# Patient Record
Sex: Male | Born: 1975 | Race: Black or African American | Hispanic: No | Marital: Single | Smoking: Current every day smoker
Health system: Southern US, Community
[De-identification: ages and names within clinical notes are randomized; demographics above are authoritative.]

## PROBLEM LIST (undated history)

## (undated) DIAGNOSIS — J45909 Unspecified asthma, uncomplicated: Secondary | ICD-10-CM

## (undated) HISTORY — DX: Unspecified asthma, uncomplicated: J45.909

---

## 2003-08-06 ENCOUNTER — Emergency Department (HOSPITAL_COMMUNITY): Admission: EM | Admit: 2003-08-06 | Discharge: 2003-08-06 | Payer: Self-pay | Admitting: Emergency Medicine

## 2007-07-05 ENCOUNTER — Emergency Department (HOSPITAL_COMMUNITY): Admission: EM | Admit: 2007-07-05 | Discharge: 2007-07-05 | Payer: Self-pay | Admitting: Family Medicine

## 2007-11-15 ENCOUNTER — Emergency Department (HOSPITAL_COMMUNITY): Admission: EM | Admit: 2007-11-15 | Discharge: 2007-11-16 | Payer: Self-pay | Admitting: *Deleted

## 2008-12-31 ENCOUNTER — Emergency Department (HOSPITAL_COMMUNITY): Admission: EM | Admit: 2008-12-31 | Discharge: 2008-12-31 | Payer: Self-pay | Admitting: Emergency Medicine

## 2009-06-30 ENCOUNTER — Emergency Department (HOSPITAL_COMMUNITY): Admission: EM | Admit: 2009-06-30 | Discharge: 2009-06-30 | Payer: Self-pay | Admitting: Emergency Medicine

## 2010-11-03 LAB — GC/CHLAMYDIA PROBE AMP, GENITAL: Chlamydia, DNA Probe: NEGATIVE

## 2010-11-08 LAB — URINALYSIS, ROUTINE W REFLEX MICROSCOPIC
Bilirubin Urine: NEGATIVE
Glucose, UA: NEGATIVE
Hgb urine dipstick: NEGATIVE
Nitrite: NEGATIVE
Specific Gravity, Urine: 1.025
pH: 7

## 2011-01-08 ENCOUNTER — Emergency Department (HOSPITAL_COMMUNITY)
Admission: EM | Admit: 2011-01-08 | Discharge: 2011-01-08 | Disposition: A | Discharge status: Home / Self Care | Payer: Self-pay

## 2012-06-20 ENCOUNTER — Ambulatory Visit (INDEPENDENT_AMBULATORY_CARE_PROVIDER_SITE_OTHER): Payer: BC Managed Care – PPO | Admitting: Emergency Medicine

## 2012-06-20 VITALS — BP 97/62 | HR 78 | Temp 98.1°F | Resp 16 | Ht 73.0 in | Wt 155.0 lb

## 2012-06-20 DIAGNOSIS — B353 Tinea pedis: Secondary | ICD-10-CM

## 2012-06-20 DIAGNOSIS — M79609 Pain in unspecified limb: Secondary | ICD-10-CM

## 2012-06-20 DIAGNOSIS — M79671 Pain in right foot: Secondary | ICD-10-CM

## 2012-06-20 DIAGNOSIS — B351 Tinea unguium: Secondary | ICD-10-CM

## 2012-06-20 MED ORDER — TERBINAFINE HCL 250 MG PO TABS: 250.0000 mg | ORAL_TABLET | Freq: Every day | ORAL | Status: DC

## 2012-06-20 NOTE — Patient Instructions (Addendum)
Ringworm, Nail  A fungal infection of the nail (tinea unguium/onychomycosis) is common. It is common as the visible part of the nail is composed of dead cells which have no blood supply to help prevent infection. It occurs because fungi are everywhere and will pick any opportunity to grow on any dead material.  Because nails are very slow growing they require up to 2 years of treatment with anti-fungal medications. The entire nail back to the base is infected. This includes approximately  of the nail which you cannot see.  If your caregiver has prescribed a medication by mouth, take it every day and as directed. No progress will be seen for at least 6 to 9 months. Do not be disappointed! Because fungi live on dead cells with little or no exposure to blood supply, medication delivery to the infection is slow; thus the cure is slow. It is also why you can observe no progress in the first 6 months. The nail becoming cured is the base of the nail, as it has the blood supply. Topical medication such as creams and ointments are usually not effective. Important in successful treatment of nail fungus is closely following the medication regimen that your doctor prescribes.  Sometimes you and your caregiver may elect to speed up this process by surgical removal of all the nails. Even this may still require 6 to 9 months of additional oral medications.  See your caregiver as directed. Remember there will be no visible improvement for at least 6 months. See your caregiver sooner if other signs of infection (redness and swelling) develop.  Document Released: 01/22/2000 Document Revised: 04/18/2011 Document Reviewed: 04/01/2008  ExitCare Patient Information 2013 [employer].

## 2012-06-20 NOTE — Progress Notes (Signed)
[facility] [address] [place] [address] [id number]  Date:  06/20/2012   Name:  Christopher Pitts   DOB:  Sep 30, 1975   MRN:  [id number]  PCP:  No PCP Per Patient    Chief Complaint: Rash   History of Present Illness:  Christopher Pitts is a 37 y.o. very pleasant male patient who presents with the following:  Pain in web between fourth and fifth toes on right foot.  Has marked thickening of great toe and fifth toe nails.  No fever or chills.  No swelling or redness No improvement with over the counter medications or other home remedies. Denies other complaint or health concern today.   There are no active problems to display for this patient.   History reviewed. No pertinent past medical history.  History reviewed. No pertinent past surgical history.  History  Substance Use Topics  . Smoking status: Current Every Day Smoker -- 0.50 packs/day    Types: Cigarettes  . Smokeless tobacco: Not on file  . Alcohol Use: Yes    Family History  Problem Relation Age of Onset  . Diabetes Mother   . Cancer Father     No Known Allergies  Medication list has been reviewed and updated.  No current outpatient prescriptions on file prior to visit.   No current facility-administered medications on file prior to visit.    Review of Systems:  As per HPI, otherwise negative.    Physical Examination: Filed Vitals:   06/20/12 1509  BP: 97/62  Pulse: 78  Temp: 98.1 F (36.7 C)  Resp: 16   Filed Vitals:   06/20/12 1509  Height: 6\' 1"  (1.854 m)  Weight: 155 lb (70.308 kg)   Body mass index is 20.45 kg/(m^2). Ideal Body Weight: Weight in (lb) to have BMI = 25: 189.1   GEN: WDWN, NAD, Non-toxic, Alert & Oriented x 3 HEENT: Atraumatic, Normocephalic.  Ears and Nose: No external deformity. EXTR: No clubbing/cyanosis/edema NEURO: Normal gait.  PSYCH: Normally interactive. Conversant. Not depressed or anxious appearing.  Calm demeanor.  RIGHT FOOT:   Marked tinea pedis, onychomycosis.    Assessment and Plan: Onychomycosis Foot pain Tinea pedis Monthly LFT Terbinafine   Signed,  Phillips Odor, MD

## 2012-06-26 ENCOUNTER — Other Ambulatory Visit: Payer: Self-pay

## 2012-06-26 MED ORDER — TERBINAFINE HCL 250 MG PO TABS: 250.0000 mg | ORAL_TABLET | Freq: Every day | ORAL | Status: DC

## 2013-07-25 ENCOUNTER — Ambulatory Visit: Payer: No Typology Code available for payment source

## 2018-06-15 ENCOUNTER — Encounter (HOSPITAL_COMMUNITY): Payer: Self-pay | Admitting: Emergency Medicine

## 2018-06-15 ENCOUNTER — Ambulatory Visit (HOSPITAL_COMMUNITY)
Admission: EM | Admit: 2018-06-15 | Discharge: 2018-06-15 | Disposition: A | Discharge status: Home / Self Care | Payer: Self-pay

## 2018-06-15 ENCOUNTER — Emergency Department (HOSPITAL_COMMUNITY): Payer: Self-pay

## 2018-06-15 ENCOUNTER — Emergency Department (HOSPITAL_COMMUNITY)
Admission: EM | Admit: 2018-06-15 | Discharge: 2018-06-15 | Disposition: A | Discharge status: Home / Self Care | Payer: Self-pay | Attending: Emergency Medicine | Admitting: Emergency Medicine

## 2018-06-15 ENCOUNTER — Other Ambulatory Visit: Payer: Self-pay

## 2018-06-15 ENCOUNTER — Encounter (HOSPITAL_COMMUNITY): Payer: Self-pay

## 2018-06-15 DIAGNOSIS — J45909 Unspecified asthma, uncomplicated: Secondary | ICD-10-CM | POA: Insufficient documentation

## 2018-06-15 DIAGNOSIS — L02215 Cutaneous abscess of perineum: Secondary | ICD-10-CM

## 2018-06-15 DIAGNOSIS — Z1159 Encounter for screening for other viral diseases: Secondary | ICD-10-CM | POA: Insufficient documentation

## 2018-06-15 DIAGNOSIS — F17210 Nicotine dependence, cigarettes, uncomplicated: Secondary | ICD-10-CM | POA: Insufficient documentation

## 2018-06-15 LAB — CBC
HCT: 34.6 % — ABNORMAL LOW (ref 39.0–52.0)
Hemoglobin: 12 g/dL — ABNORMAL LOW (ref 13.0–17.0)
MCH: 33.4 pg (ref 26.0–34.0)
MCHC: 34.7 g/dL (ref 30.0–36.0)
MCV: 96.4 fL (ref 80.0–100.0)
Platelets: 256 10*3/uL (ref 150–400)
RBC: 3.59 MIL/uL — ABNORMAL LOW (ref 4.22–5.81)
RDW: 13.4 % (ref 11.5–15.5)
WBC: 13.9 10*3/uL — ABNORMAL HIGH (ref 4.0–10.5)
nRBC: 0 % (ref 0.0–0.2)

## 2018-06-15 LAB — COMPREHENSIVE METABOLIC PANEL
ALT: 30 U/L (ref 0–44)
AST: 37 U/L (ref 15–41)
Albumin: 3.2 g/dL — ABNORMAL LOW (ref 3.5–5.0)
Alkaline Phosphatase: 72 U/L (ref 38–126)
Anion gap: 13 (ref 5–15)
BUN: 7 mg/dL (ref 6–20)
CO2: 23 mmol/L (ref 22–32)
Calcium: 8.9 mg/dL (ref 8.9–10.3)
Chloride: 97 mmol/L — ABNORMAL LOW (ref 98–111)
Creatinine, Ser: 0.89 mg/dL (ref 0.61–1.24)
GFR calc Af Amer: >60 mL/min (ref >60)
GFR calc non Af Amer: >60 mL/min (ref >60)
Glucose, Bld: 98 mg/dL (ref 70–99)
Potassium: 4.3 mmol/L (ref 3.5–5.1)
Sodium: 133 mmol/L — ABNORMAL LOW (ref 135–145)
Total Bilirubin: 1 mg/dL (ref 0.3–1.2)
Total Protein: 6.1 g/dL — ABNORMAL LOW (ref 6.5–8.1)

## 2018-06-15 LAB — SARS CORONAVIRUS 2 BY RT PCR (HOSPITAL ORDER, PERFORMED IN ~~LOC~~ HOSPITAL LAB): SARS Coronavirus 2: NEGATIVE

## 2018-06-15 MED ORDER — HYDROMORPHONE HCL 1 MG/ML IJ SOLN
0.5000 mg | Freq: Once | INTRAMUSCULAR | Status: AC
  Administered 2018-06-15: 15:00:00 0.5 mg via INTRAVENOUS
  Filled 2018-06-15: qty 1

## 2018-06-15 MED ORDER — IOHEXOL 300 MG/ML  SOLN
100.0000 mL | Freq: Once | INTRAMUSCULAR | Status: AC | PRN
  Administered 2018-06-15: 100 mL via INTRAVENOUS

## 2018-06-15 MED ORDER — CEPHALEXIN 500 MG PO CAPS: 500.0000 mg | ORAL_CAPSULE | Freq: Three times a day (TID) | ORAL | 0 refills | Status: AC

## 2018-06-15 MED ORDER — CEPHALEXIN 250 MG PO CAPS
500.0000 mg | ORAL_CAPSULE | Freq: Once | ORAL | Status: AC
  Administered 2018-06-15: 500 mg via ORAL
  Filled 2018-06-15: qty 2

## 2018-06-15 MED ORDER — FENTANYL CITRATE (PF) 100 MCG/2ML IJ SOLN
100.0000 ug | Freq: Once | INTRAMUSCULAR | Status: AC
  Administered 2018-06-15: 100 ug via INTRAVENOUS
  Filled 2018-06-15: qty 2

## 2018-06-15 MED ORDER — HYDROMORPHONE HCL 1 MG/ML IJ SOLN
1.0000 mg | Freq: Once | INTRAMUSCULAR | Status: AC
  Administered 2018-06-15: 1 mg via INTRAVENOUS
  Filled 2018-06-15: qty 1

## 2018-06-15 MED ORDER — LIDOCAINE HCL 2 % IJ SOLN
20.0000 mL | Freq: Once | INTRAMUSCULAR | Status: AC
  Administered 2018-06-15: 400 mg
  Filled 2018-06-15: qty 20

## 2018-06-15 MED ORDER — SULFAMETHOXAZOLE-TRIMETHOPRIM 800-160 MG PO TABS: 1.0000 | ORAL_TABLET | Freq: Two times a day (BID) | ORAL | 0 refills | Status: AC

## 2018-06-15 MED ORDER — SODIUM CHLORIDE 0.9 % IV BOLUS
1000.0000 mL | Freq: Once | INTRAVENOUS | Status: AC
  Administered 2018-06-15: 1000 mL via INTRAVENOUS

## 2018-06-15 MED ORDER — SULFAMETHOXAZOLE-TRIMETHOPRIM 800-160 MG PO TABS
1.0000 | ORAL_TABLET | Freq: Once | ORAL | Status: AC
  Administered 2018-06-15: 1 via ORAL
  Filled 2018-06-15: qty 1

## 2018-06-15 NOTE — Discharge Instructions (Signed)
Recommending further evaluation and management in the ED for rectal abscess.  Patient aware and in agreement with plan.  Escorted to ED by nursing staff.

## 2018-06-15 NOTE — Discharge Instructions (Addendum)
You were evaluated in the Emergency Department and after careful evaluation, we did not find any emergent condition requiring admission or further testing in the hospital.  Your symptoms today seem to be due to an abscess, which we drained in the emergency department.  Your CT today did not show any other complicating findings.  As discussed, please gently pull 1 to 2 inches of the packing material out of the wound once daily until the packing material falls out.  Please take both antibiotics as directed.  Please return to the Emergency Department if you experience any worsening of your condition.  We encourage you to follow up with a primary care provider.  Thank you for allowing [place] to be a part of your care.

## 2018-06-15 NOTE — ED Notes (Signed)
Supplies for I&D placed at bedside and assisted Dr. Pilar Plate.  Large amount of drainage noted.  Packing done by Dr. Aggie Hacker.  Pt states he got complete pain relief after drainage

## 2018-06-15 NOTE — ED Triage Notes (Signed)
Pt sent as referral from urgent care for eval of rectal abscess x 3 days. Pt reports urgent care stated "they might not be able to take care of it as it is too deep". Pt denies fever/chills, denies hx of same

## 2018-06-15 NOTE — ED Notes (Signed)
Patient verbalizes understanding of discharge instructions. Opportunity for questioning and answers were provided. Armband removed by staff, pt discharged from ED ambulatory.   

## 2018-06-15 NOTE — ED Provider Notes (Signed)
[facility] Emergency Department Provider Note MRN:  [id number]  Arrival date & time: 06/15/18     Chief Complaint   Abscess   History of Present Illness   Christopher Pitts is a 43 y.o. year-old male with no pertinent past medical history presenting to the ED with chief complaint of abscess.  3 days of rectal pain and swelling that he describes as an abscess.  Pain is now severe, 8 out of 10, severe pain with sitting or attempting a bowel movement.  Denies fever, no headache or vision change, no chest pain or shortness of breath, no abdominal pain.  The pain seems to radiate into the scrotum.  No other exacerbating or alleviating factors pain is constant.  Review of Systems  A complete 10 system review of systems was obtained and all systems are negative except as noted in the HPI and PMH.   Patient's Health History    Past Medical History:  Diagnosis Date  . Asthma     History reviewed. No pertinent surgical history.  Family History  Problem Relation Age of Onset  . Diabetes Mother   . Cancer Father     Social History   Socioeconomic History  . Marital status: Single    Spouse name: Not on file  . Number of children: Not on file  . Years of education: Not on file  . Highest education level: Not on file  Occupational History  . Not on file  Social Needs  . Financial resource strain: Not on file  . Food insecurity:    Worry: Not on file    Inability: Not on file  . Transportation needs:    Medical: Not on file    Non-medical: Not on file  Tobacco Use  . Smoking status: Current Every Day Smoker    Packs/day: 0.50    Types: Cigarettes  Substance and Sexual Activity  . Alcohol use: Yes    Alcohol/week: 7.0 standard drinks    Types: 7 Standard drinks or equivalent per week  . Drug use: No  . Sexual activity: Yes  Lifestyle  . Physical activity:    Days per week: Not on file    Minutes per session: Not on file  . Stress: Not on file   Relationships  . Social connections:    Talks on phone: Not on file    Gets together: Not on file    Attends religious service: Not on file    Active member of club or organization: Not on file    Attends meetings of clubs or organizations: Not on file    Relationship status: Not on file  . Intimate partner violence:    Fear of current or ex partner: Not on file    Emotionally abused: Not on file    Physically abused: Not on file    Forced sexual activity: Not on file  Other Topics Concern  . Not on file  Social History Narrative  . Not on file     Physical Exam  Vital Signs and Nursing Notes reviewed Vitals:   06/15/18 1511 06/15/18 1800  BP: (!) 145/82 124/80  Pulse: 71 67  Resp: 20 18  Temp:  98.2 F (36.8 C)  SpO2: 100% 99%    CONSTITUTIONAL: Well-appearing, NAD NEURO:  Alert and oriented x 3, no focal deficits EYES:  eyes equal and reactive ENT/NECK:  no LAD, no JVD CARDIO: Regular rate, well-perfused, normal S1 and S2 PULM:  CTAB no wheezing  or rhonchi GI/GU:  normal bowel sounds, non-distended, non-tender MSK/SPINE:  No gross deformities, no edema SKIN: There is a round 3 to 4 cm exquisitely tender abscess of the perineum adjacent to and seemingly extending into the anus as well as the more posterior scrotum; there is a possible fistula or tract adjacent to the abscess that is also moderately tender to palpation PSYCH:  Appropriate speech and behavior  Diagnostic and Interventional Summary    Labs Reviewed  CBC - Abnormal; Notable for the following components:      Result Value   WBC 13.9 (*)    RBC 3.59 (*)    Hemoglobin 12.0 (*)    HCT 34.6 (*)    All other components within normal limits  COMPREHENSIVE METABOLIC PANEL - Abnormal; Notable for the following components:   Sodium 133 (*)    Chloride 97 (*)    Total Protein 6.1 (*)    Albumin 3.2 (*)    All other components within normal limits  SARS CORONAVIRUS 2 (HOSPITAL ORDER, PERFORMED IN [facility])  LACTIC ACID, PLASMA    CT PELVIS W CONTRAST  Final Result      Medications  HYDROmorphone (DILAUDID) injection 1 mg (1 mg Intravenous Given 06/15/18 1337)  sodium chloride 0.9 % bolus 1,000 mL (0 mLs Intravenous Stopped 06/15/18 1438)  HYDROmorphone (DILAUDID) injection 0.5 mg (0.5 mg Intravenous Given 06/15/18 1507)  iohexol (OMNIPAQUE) 300 MG/ML solution 100 mL (100 mLs Intravenous Contrast Given 06/15/18 1632)  lidocaine (XYLOCAINE) 2 % (with pres) injection 400 mg (400 mg Other Given 06/15/18 1741)  fentaNYL (SUBLIMAZE) injection 100 mcg (100 mcg Intravenous Given 06/15/18 1740)  cephALEXin (KEFLEX) capsule 500 mg (500 mg Oral Given 06/15/18 1738)  sulfamethoxazole-trimethoprim (BACTRIM DS) 800-160 MG per tablet 1 tablet (1 tablet Oral Given 06/15/18 1738)     .Marland KitchenIncision and Drainage Date/Time: 06/15/2018 6:20 PM Performed by: Sabas Sous, MD Authorized by: Sabas Sous, MD   Consent:    Consent obtained:  Verbal   Consent given by:  Patient   Risks discussed:  Bleeding, incomplete drainage, pain, infection and damage to other organs Location:    Type:  Abscess   Size:  4cm   Location:  Anogenital   Anogenital location:  Perianal Pre-procedure details:    Skin preparation:  Chloraprep Sedation:    Sedation type:  Anxiolysis ( fentanyl) Anesthesia (see MAR for exact dosages):    Anesthesia method:  Local infiltration   Local anesthetic:  Lidocaine 2% w/o epi Procedure type:    Complexity:  Simple Procedure details:    Incision types:  Single straight   Incision depth:  Submucosal   Scalpel blade:  11   Wound management:  Probed and deloculated   Drainage:  Purulent   Drainage amount:  Copious   Wound treatment:  Wound left open   Packing materials:  1/4 in gauze   Amount 1/4":  10cm Post-procedure details:    Patient tolerance of procedure:  Tolerated well, no immediate complications Comments:     An incision was made at the center of the  abscess, at least 2 cm away from the anus.   Critical Care  ED Course and Medical Decision Making  I have reviewed the triage vital signs and the nursing notes.  Pertinent labs & imaging results that were available during my care of the patient were reviewed by me and considered in my medical decision making (see below for details).  Concern for  perirectal abscess in this [age] year old male without other significant past medical history.  Patient has a history of psoriasis with evident patches of psoriatic rash to his shins.  Given this and the possibility of a fistula next to this perirectal abscess, inflammatory bowel disease should also be considered.  Will obtain labs, CT to determine extent of abscess.  May need surgical consultation.  Clinical Course as of Jun 14 1817  Fri Jun 15, 2018  1721 CT scan reveals a superficial 3 cm x 3 cm abscess without any other complicating features.  This can be managed without general surgery consultation.  We will set up for incision and drainage here in the emergency department.   [MB]    Clinical Course User Index [MB] Sabas SousBero, Cayleen Benjamin M, MD      Patient tolerated incision and drainage very well.  Special care was made to make the incision away from the anus to prevent any injury to this area.  Patient advised to remove 1 to 2 inches of the packing material once daily until the entire material falls out.  Strict return precautions for return of pain or swelling, fever.  Patient has no signs of Sirs or sepsis here in the emergency department, states that the pain is now resolved.  Appropriate for discharge.  After the discussed management above, the patient was determined to be safe for discharge.  The patient was in agreement with this plan and all questions regarding their care were answered.  ED return precautions were discussed and the patient will return to the ED with any significant worsening of condition.  Elmer SowMichael M. Pilar PlateBero, MD [facility]  Emergency Medicine [facility] [email or web address]  Final Clinical Impressions(s) / ED Diagnoses     ICD-10-CM   1. Abscess of superficial perineal space L02.215     ED Discharge Orders         Ordered    sulfamethoxazole-trimethoprim (BACTRIM DS) 800-160 MG tablet  2 times daily     06/15/18 1819    cephALEXin (KEFLEX) 500 MG capsule  3 times daily     06/15/18 1819             Sabas SousBero, Burk Hoctor M, MD 06/15/18 1824

## 2018-06-15 NOTE — ED Provider Notes (Signed)
[facility]   [id number] 06/15/18 Arrival Time: 1153   CC: ABSCESS  SUBJECTIVE:  Christopher Pitts is a 43 y.o. male who presents with a possible abscess of his rectum x 3 days. Abrupt onset.  Has not tried OTC medications.  Denies similar symptoms in the past.  Denies fever, chills, nausea, vomiting.    ROS: As per HPI.  Past Medical History:  Diagnosis Date  . Asthma    History reviewed. No pertinent surgical history. No Known Allergies No current facility-administered medications on file prior to encounter.    Current Outpatient Medications on File Prior to Encounter  Medication Sig Dispense Refill  . terbinafine (LAMISIL) 250 MG tablet Take 1 tablet (250 mg total) by mouth daily. 30 tablet 3   Social History   Socioeconomic History  . Marital status: Single    Spouse name: Not on file  . Number of children: Not on file  . Years of education: Not on file  . Highest education level: Not on file  Occupational History  . Not on file  Social Needs  . Financial resource strain: Not on file  . Food insecurity:    Worry: Not on file    Inability: Not on file  . Transportation needs:    Medical: Not on file    Non-medical: Not on file  Tobacco Use  . Smoking status: Current Every Day Smoker    Packs/day: 0.50    Types: Cigarettes  Substance and Sexual Activity  . Alcohol use: Yes    Alcohol/week: 7.0 standard drinks    Types: 7 Standard drinks or equivalent per week  . Drug use: No  . Sexual activity: Yes  Lifestyle  . Physical activity:    Days per week: Not on file    Minutes per session: Not on file  . Stress: Not on file  Relationships  . Social connections:    Talks on phone: Not on file    Gets together: Not on file    Attends religious service: Not on file    Active member of club or organization: Not on file    Attends meetings of clubs or organizations: Not on file    Relationship status: Not on file  . Intimate partner violence:    Fear  of current or ex partner: Not on file    Emotionally abused: Not on file    Physically abused: Not on file    Forced sexual activity: Not on file  Other Topics Concern  . Not on file  Social History Narrative  . Not on file   Family History  Problem Relation Age of Onset  . Diabetes Mother   . Cancer Father     OBJECTIVE:  Vitals:   06/15/18 1237  BP: 122/83  Pulse: (!) 104  Resp: 18  Temp: 99.4 F (37.4 C)  SpO2: 96%     General appearance: alert; appears uncomfortable Skin: Cornelius Moras RN present as chaperone: approximately 3-4 cm abscess to perineum appears to track into the anus; exquisitely tender to touch; no active drainage Psychological: alert and cooperative; normal mood and affect  ASSESSMENT & PLAN:  1. Abscess, perineum     No orders of the defined types were placed in this encounter.  Recommending further evaluation and management in the ED for rectal abscess.  Patient aware and in agreement with plan.  Escorted to ED by nursing staff.           Alvino Chapel, [place], PA-C  06/15/18 1253  

## 2018-06-15 NOTE — ED Triage Notes (Signed)
Pt c/o rectal abscess x3 days.

## 2018-07-19 ENCOUNTER — Encounter (HOSPITAL_COMMUNITY): Payer: Self-pay

## 2018-07-19 ENCOUNTER — Ambulatory Visit (HOSPITAL_COMMUNITY)
Admission: EM | Admit: 2018-07-19 | Discharge: 2018-07-19 | Disposition: A | Discharge status: Home / Self Care | Payer: Self-pay | Attending: Internal Medicine | Admitting: Internal Medicine

## 2018-07-19 ENCOUNTER — Other Ambulatory Visit: Payer: Self-pay

## 2018-07-19 DIAGNOSIS — K029 Dental caries, unspecified: Secondary | ICD-10-CM

## 2018-07-19 DIAGNOSIS — K047 Periapical abscess without sinus: Secondary | ICD-10-CM

## 2018-07-19 MED ORDER — AMOXICILLIN-POT CLAVULANATE 875-125 MG PO TABS: 1.0000 | ORAL_TABLET | Freq: Two times a day (BID) | ORAL | 0 refills | Status: AC

## 2018-07-19 MED ORDER — HYDROCODONE-ACETAMINOPHEN 5-325 MG PO TABS: 1.0000 | ORAL_TABLET | ORAL | 0 refills | Status: DC | PRN

## 2018-07-19 NOTE — Discharge Instructions (Addendum)
Return if symptoms do not improve/worsen in 24-48 hours with antibiotic use. Take pain medication as needed, especially at bedtime to help sleep.

## 2018-07-19 NOTE — ED Provider Notes (Signed)
[facility]    CSN: [id number] Arrival date & time: 07/19/18  [place]     History   Chief Complaint Chief Complaint  Patient presents with  . Dental Pain    HPI Christopher Pitts is a 43 y.o. male presenting for dental pain and swelling.  Patient states that he noticed pain in his left upper tooth 2 days ago, since then has developed increased pain that hurts worse when he chews and keeps him up at night, left eye swelling and cheek swelling.  Patient denies fever, malaise, myalgias, ear or eye pain, change in vision or hearing.  Patient denies difficulty swallowing, choking.  No known active discharge.  Patient denies history of dental, tonsillar abscess.  Patient denies history of HIV or hepatitis, autoimmune disorders, or conditions.  No previous infection of MRSA.    Past Medical History:  Diagnosis Date  . Asthma     There are no active problems to display for this patient.   History reviewed. No pertinent surgical history.     Home Medications    Prior to Admission medications   Medication Sig Start Date End Date Taking? Authorizing Provider  amoxicillin-clavulanate (AUGMENTIN) 875-125 MG tablet Take 1 tablet by mouth every 12 (twelve) hours for 10 days. 07/19/18 07/29/18  Hall-Potvin, [place], PA-C  HYDROcodone-acetaminophen (NORCO/VICODIN) 5-325 MG tablet Take 1 tablet by mouth every 4 (four) hours as needed. 07/19/18   Hall-Potvin, [place], PA-C    Family History Family History  Problem Relation Age of Onset  . Diabetes Mother   . Cancer Father     Social History Social History   Tobacco Use  . Smoking status: Current Every Day Smoker    Packs/day: 0.50    Types: Cigarettes  . Smokeless tobacco: Never Used  Substance Use Topics  . Alcohol use: Yes    Alcohol/week: 7.0 standard drinks    Types: 7 Standard drinks or equivalent per week  . Drug use: No     Allergies   Patient has no known allergies.   Review of Systems As per HPI    Physical Exam Triage Vital Signs ED Triage Vitals  Enc Vitals Group     BP 07/19/18 1625 124/68     Pulse Rate 07/19/18 1625 88     Resp 07/19/18 1625 18     Temp 07/19/18 1625 98.4 F (36.9 C)     Temp src --      SpO2 07/19/18 1625 100 %     Weight 07/19/18 1623 165 lb (74.8 kg)     Height --      Head Circumference --      Peak Flow --      Pain Score 07/19/18 1623 2     Pain Loc --      Pain Edu? --      Excl. in [place]? --    No data found.  Updated Vital Signs BP 124/68 (BP Location: Right Arm)   Pulse 88   Temp 98.4 F (36.9 C)   Resp 18   Wt 165 lb (74.8 kg)   SpO2 100%   BMI 21.77 kg/m   Visual Acuity Right Eye Distance:   Left Eye Distance:   Bilateral Distance:    Right Eye Near:   Left Eye Near:    Bilateral Near:     Physical Exam Constitutional:      General: He is not in acute distress. HENT:     Head: Normocephalic and atraumatic.  Right Ear: Tympanic membrane, ear canal and external ear normal.     Left Ear: Tympanic membrane, ear canal and external ear normal.     Nose: Congestion present. No rhinorrhea.     Mouth/Throat:     Mouth: Mucous membranes are moist.     Pharynx: Oropharynx is clear. No oropharyngeal exudate or posterior oropharyngeal erythema.     Comments:  Significant tooth decay, gingival swelling, exquisitely tender to palpation with left upper third tooth.  No fluctuance appreciated.   Eyes:     General: No scleral icterus.       Right eye: No discharge.        Left eye: No discharge.     Extraocular Movements: Extraocular movements intact.     Conjunctiva/sclera: Conjunctivae normal.     Pupils: Pupils are equal, round, and reactive to light.     Comments: EOM without pain  Neck:     Musculoskeletal: Normal range of motion and neck supple. No neck rigidity or muscular tenderness.  Cardiovascular:     Rate and Rhythm: Normal rate.  Pulmonary:     Effort: Pulmonary effort is normal.  Lymphadenopathy:     Cervical:  Cervical adenopathy present.  Skin:    General: Skin is warm.     Coloration: Skin is not jaundiced or pale.  Neurological:     Mental Status: He is alert and oriented to person, place, and time.      UC Treatments / Results  Labs (all labs ordered are listed, but only abnormal results are displayed) Labs Reviewed - No data to display  EKG None  Radiology No results found.  Procedures Procedures (including critical care time)  Medications Ordered in UC Medications - No data to display  Initial Impression / Assessment and Plan / UC Course  I have reviewed the triage vital signs and the nursing notes.  Pertinent labs & imaging results that were available during my care of the patient were reviewed by me and considered in my medical decision making (see chart for details).     43 year old male presenting for dental pain.  History and exam concerning for infected dental carry.  Will treat with Augmentin, and Tylenol No. 5 to help patient sleep through night.  Patient instructed to follow-up with dentist for routine care.  Discussed return precautions, patient verbalized understanding. Final Clinical Impressions(s) / UC Diagnoses   Final diagnoses:  Infected dental caries     Discharge Instructions     Return if symptoms do not improve/worsen in 24-48 hours with antibiotic use. Take pain medication as needed, especially at bedtime to help sleep.    ED Prescriptions    Medication Sig Dispense Auth. Provider   amoxicillin-clavulanate (AUGMENTIN) 875-125 MG tablet Take 1 tablet by mouth every 12 (twelve) hours for 10 days. 20 tablet Hall-Potvin, [place], PA-C   HYDROcodone-acetaminophen (NORCO/VICODIN) 5-325 MG tablet Take 1 tablet by mouth every 4 (four) hours as needed. 8 tablet Hall-Potvin, [place], PA-C     Controlled Substance Prescriptions [place] Controlled Substance Registry consulted? Yes, I have consulted the [place] Controlled Substances Registry for this patient,  and feel the risk/benefit ratio today is favorable for proceeding with this prescription for a controlled substance.   Hall-Potvin, [place], [place]-C 07/19/18 1709

## 2018-07-19 NOTE — ED Triage Notes (Signed)
Pt states he had a toothache x 2 days. Pt says he used some oral gel on his tooth and now he has facial swelling.

## 2020-02-12 ENCOUNTER — Ambulatory Visit (HOSPITAL_COMMUNITY)
Admission: EM | Admit: 2020-02-12 | Discharge: 2020-02-12 | Disposition: A | Discharge status: Home / Self Care | Payer: Self-pay | Attending: Family Medicine | Admitting: Family Medicine

## 2020-02-12 ENCOUNTER — Encounter (HOSPITAL_COMMUNITY): Payer: Self-pay

## 2020-02-12 DIAGNOSIS — K0889 Other specified disorders of teeth and supporting structures: Secondary | ICD-10-CM

## 2020-02-12 MED ORDER — IBUPROFEN 800 MG PO TABS: 800.0000 mg | ORAL_TABLET | Freq: Three times a day (TID) | ORAL | 0 refills | Status: DC

## 2020-02-12 MED ORDER — IBUPROFEN 800 MG PO TABS
800.0000 mg | ORAL_TABLET | Freq: Once | ORAL | Status: AC
  Administered 2020-02-12: 800 mg via ORAL

## 2020-02-12 MED ORDER — CLINDAMYCIN HCL 300 MG PO CAPS: 300.0000 mg | ORAL_CAPSULE | Freq: Three times a day (TID) | ORAL | 0 refills | Status: DC

## 2020-02-12 MED ORDER — HYDROCODONE-ACETAMINOPHEN 5-325 MG PO TABS: 1.0000 | ORAL_TABLET | Freq: Four times a day (QID) | ORAL | 0 refills | Status: DC | PRN

## 2020-02-12 MED ORDER — IBUPROFEN 800 MG PO TABS
ORAL_TABLET | ORAL | Status: AC
  Filled 2020-02-12: qty 1

## 2020-02-12 NOTE — ED Triage Notes (Signed)
Pt in with c/o right lower gum abscess in mouth that he noticed on Saturday.  Pt has take ibuprofen with no relief  Facial swelling noted to right side of face

## 2020-02-12 NOTE — Discharge Instructions (Addendum)

## 2020-02-12 NOTE — ED Provider Notes (Signed)
[facility]   [id number] 02/12/20 Arrival Time: 1146  ASSESSMENT & PLAN:  1. Pain, dental    No sign of abscess requiring I&D at this time. Discussed.  Meds ordered this encounter  Medications  . clindamycin (CLEOCIN) 300 MG capsule    Sig: Take 1 capsule (300 mg total) by mouth 3 (three) times daily.    Dispense:  30 capsule    Refill:  0  . ibuprofen (ADVIL) 800 MG tablet    Sig: Take 1 tablet (800 mg total) by mouth 3 (three) times daily with meals.    Dispense:  21 tablet    Refill:  0  . HYDROcodone-acetaminophen (NORCO/VICODIN) 5-325 MG tablet    Sig: Take 1 tablet by mouth every 6 (six) hours as needed for moderate pain or severe pain.    Dispense:  10 tablet    Refill:  0    [place] Controlled Substances Registry consulted for this patient. I feel the risk/benefit ratio today is favorable for proceeding with this prescription for a controlled substance. Medication sedation precautions given.  Dental resource written instructions given. He will schedule dental evaluation as soon as possible if not improving over the next 24-48 hours.  Reviewed expectations re: course of current medical issues. Questions answered. Outlined signs and symptoms indicating need for more acute intervention. Patient verbalized understanding. After Visit Summary given.   SUBJECTIVE:  Christopher Pitts is a 45 y.o. male who reports gradual onset of right lower dental pain described as aching/throbbing. Present for several days. Fever: absent. Tolerating PO intake but reports pain with chewing. Normal swallowing. He does not see a dentist regularly. No neck swelling or pain. OTC analgesics without relief.    OBJECTIVE: Vitals:   02/12/20 1346  BP: (!) 157/85  Pulse: (!) 57  Resp: 19  Temp: 99 F (37.2 C)  TempSrc: Temporal  SpO2: 100%    General appearance: alert; no distress HENT: normocephalic; atraumatic; dentition: fair; right upper gums without areas of fluctuance,  drainage, or bleeding and with tenderness to palpation; normal jaw movement without difficulty Neck: supple without LAD; FROM; trachea midline Lungs: normal respirations; unlabored; speaks full sentences without difficulty Skin: warm and dry Psychological: alert and cooperative; normal mood and affect  No Known Allergies  Past Medical History:  Diagnosis Date  . Asthma    Social History   Socioeconomic History  . Marital status: Single    Spouse name: Not on file  . Number of children: Not on file  . Years of education: Not on file  . Highest education level: Not on file  Occupational History  . Not on file  Tobacco Use  . Smoking status: Current Every Day Smoker    Packs/day: 0.50    Types: Cigarettes  . Smokeless tobacco: Never Used  Substance and Sexual Activity  . Alcohol use: Yes    Alcohol/week: 7.0 standard drinks    Types: 7 Standard drinks or equivalent per week  . Drug use: No  . Sexual activity: Yes  Other Topics Concern  . Not on file  Social History Narrative  . Not on file   Social Determinants of Health   Financial Resource Strain: Not on file  Food Insecurity: Not on file  Transportation Needs: Not on file  Physical Activity: Not on file  Stress: Not on file  Social Connections: Not on file  Intimate Partner Violence: Not on file   Family History  Problem Relation Age of Onset  . Diabetes  Mother   . Cancer Father    History reviewed. No pertinent surgical history.   Mardella Layman, MD 02/13/20 1432

## 2020-07-01 ENCOUNTER — Emergency Department (HOSPITAL_COMMUNITY)
Admission: EM | Admit: 2020-07-01 | Discharge: 2020-07-01 | Disposition: A | Discharge status: Home / Self Care | Payer: Self-pay | Attending: Emergency Medicine | Admitting: Emergency Medicine

## 2020-07-01 ENCOUNTER — Other Ambulatory Visit: Payer: Self-pay

## 2020-07-01 ENCOUNTER — Emergency Department (HOSPITAL_COMMUNITY): Payer: Self-pay

## 2020-07-01 ENCOUNTER — Encounter (HOSPITAL_COMMUNITY): Payer: Self-pay

## 2020-07-01 DIAGNOSIS — M25512 Pain in left shoulder: Secondary | ICD-10-CM

## 2020-07-01 DIAGNOSIS — S42022A Displaced fracture of shaft of left clavicle, initial encounter for closed fracture: Secondary | ICD-10-CM | POA: Insufficient documentation

## 2020-07-01 DIAGNOSIS — W19XXXA Unspecified fall, initial encounter: Secondary | ICD-10-CM

## 2020-07-01 DIAGNOSIS — W1830XA Fall on same level, unspecified, initial encounter: Secondary | ICD-10-CM | POA: Insufficient documentation

## 2020-07-01 DIAGNOSIS — F17210 Nicotine dependence, cigarettes, uncomplicated: Secondary | ICD-10-CM | POA: Insufficient documentation

## 2020-07-01 DIAGNOSIS — J45909 Unspecified asthma, uncomplicated: Secondary | ICD-10-CM | POA: Insufficient documentation

## 2020-07-01 MED ORDER — NAPROXEN 500 MG PO TABS: 500.0000 mg | ORAL_TABLET | Freq: Two times a day (BID) | ORAL | 0 refills | Status: DC

## 2020-07-01 MED ORDER — HYDROCODONE-ACETAMINOPHEN 5-325 MG PO TABS: 2.0000 | ORAL_TABLET | ORAL | 0 refills | Status: DC | PRN

## 2020-07-01 MED ORDER — NAPROXEN 500 MG PO TABS: 500.0000 mg | ORAL_TABLET | Freq: Two times a day (BID) | ORAL | 0 refills | Status: AC

## 2020-07-01 MED ORDER — HYDROCODONE-ACETAMINOPHEN 5-325 MG PO TABS: 2.0000 | ORAL_TABLET | ORAL | 0 refills | Status: AC | PRN

## 2020-07-01 MED ORDER — NAPROXEN 500 MG PO TABS: 500.0000 mg | ORAL_TABLET | Freq: Once | ORAL | Status: DC

## 2020-07-01 MED ORDER — NAPROXEN 500 MG PO TABS
500.0000 mg | ORAL_TABLET | Freq: Once | ORAL | Status: AC
  Administered 2020-07-01: 500 mg via ORAL
  Filled 2020-07-01: qty 1

## 2020-07-01 NOTE — ED Notes (Signed)
Patient ambulatory to restroom with no assistance needed. 

## 2020-07-01 NOTE — Discharge Instructions (Signed)
The x-ray of your clavicle showed a mildly displaced fracture. You were placed on a shoulder sling to help with comfort.  I have also prescribed NSAIDS in order to help with pain control.  The number to Dr. Linna Caprice is attached to your chart, please schedule an appointment in order to follow up for your left clavicle fracture.

## 2020-07-01 NOTE — ED Notes (Signed)
Patient transported to X-ray 

## 2020-07-01 NOTE — ED Triage Notes (Addendum)
  Patient arrived via [facility] from home.   Patient reports throwing football with kids and tripped and fell on left shoulder. Patient reports he heard a pop/crack.   Possible dislocation  No obvious deformities   Patient admits to drinking alcohol today.  Denies other drugs at his time.   C/o pain to left shoulder Abrasion to back side of left shoulder and abrasion to left hand.   A/Ox4 Ambulatory with ems   VS: 112/84 HR-74 RR-18 95% RA CBG-139 t-97.8
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
## 2020-07-13 ENCOUNTER — Other Ambulatory Visit: Payer: Self-pay

## 2020-07-13 DIAGNOSIS — S51012A Laceration without foreign body of left elbow, initial encounter: Secondary | ICD-10-CM | POA: Insufficient documentation

## 2020-07-13 DIAGNOSIS — J45909 Unspecified asthma, uncomplicated: Secondary | ICD-10-CM | POA: Insufficient documentation

## 2020-07-13 DIAGNOSIS — W25XXXA Contact with sharp glass, initial encounter: Secondary | ICD-10-CM | POA: Insufficient documentation

## 2020-07-13 DIAGNOSIS — Y92009 Unspecified place in unspecified non-institutional (private) residence as the place of occurrence of the external cause: Secondary | ICD-10-CM | POA: Insufficient documentation

## 2020-07-13 DIAGNOSIS — F17210 Nicotine dependence, cigarettes, uncomplicated: Secondary | ICD-10-CM | POA: Insufficient documentation

## 2020-07-13 DIAGNOSIS — Z23 Encounter for immunization: Secondary | ICD-10-CM | POA: Insufficient documentation

## 2020-07-14 ENCOUNTER — Emergency Department (HOSPITAL_COMMUNITY): Payer: Self-pay

## 2020-07-14 ENCOUNTER — Emergency Department (HOSPITAL_COMMUNITY)
Admission: EM | Admit: 2020-07-14 | Discharge: 2020-07-14 | Disposition: A | Discharge status: Home / Self Care | Payer: Self-pay | Attending: Emergency Medicine | Admitting: Emergency Medicine

## 2020-07-14 ENCOUNTER — Encounter (HOSPITAL_COMMUNITY): Payer: Self-pay | Admitting: Emergency Medicine

## 2020-07-14 DIAGNOSIS — S41112A Laceration without foreign body of left upper arm, initial encounter: Secondary | ICD-10-CM

## 2020-07-14 MED ORDER — LIDOCAINE-EPINEPHRINE (PF) 2 %-1:200000 IJ SOLN
10.0000 mL | Freq: Once | INTRAMUSCULAR | Status: AC
  Administered 2020-07-14: 10 mL
  Filled 2020-07-14: qty 20

## 2020-07-14 MED ORDER — TETANUS-DIPHTH-ACELL PERTUSSIS 5-2.5-18.5 LF-MCG/0.5 IM SUSY
0.5000 mL | PREFILLED_SYRINGE | Freq: Once | INTRAMUSCULAR | Status: AC
  Administered 2020-07-14: 0.5 mL via INTRAMUSCULAR
  Filled 2020-07-14: qty 0.5

## 2020-07-14 NOTE — Discharge Instructions (Addendum)
You were seen in the emergency department today for a laceration. Your laceration was closed with 3 stitches. Please keep this area clean and dry for the next 24 hours, after 24 hours you may get this area wet, but avoid soaking the area. Keep the area covered as best possible especially when in the sun to help in minimizing scarring.   Your tetanus has been updated   You will need to have the stitches removed and the wound rechecked in 7 days. Please return to the emergency department, go to an urgent care, or see your primary care provider to have this performed. Return to the ER soon should you start to experience pus type drainage from the wound, redness around the wound, or fevers as this could indicate the area is infected, please return to the ER for any other worsening symptoms or concerns that you may have.
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
## 2020-07-21 ENCOUNTER — Ambulatory Visit (HOSPITAL_COMMUNITY)
Admission: EM | Admit: 2020-07-21 | Discharge: 2020-07-21 | Disposition: A | Discharge status: Home / Self Care | Payer: Self-pay | Attending: Emergency Medicine | Admitting: Emergency Medicine

## 2020-07-21 ENCOUNTER — Telehealth (HOSPITAL_COMMUNITY): Payer: Self-pay | Admitting: Internal Medicine

## 2020-07-21 ENCOUNTER — Other Ambulatory Visit: Payer: Self-pay

## 2020-07-21 MED ORDER — BACITRACIN ZINC 500 UNIT/GM EX OINT
TOPICAL_OINTMENT | CUTANEOUS | Status: AC
  Filled 2020-07-21: qty 0.9

## 2020-07-21 MED ORDER — CEPHALEXIN 500 MG PO CAPS: 500.0000 mg | ORAL_CAPSULE | Freq: Three times a day (TID) | ORAL | 0 refills | Status: AC

## 2020-07-21 NOTE — ED Triage Notes (Signed)
Patient presents for suture removal to the left arm

## 2020-07-21 NOTE — Telephone Encounter (Signed)
Sutured wound looks infected. Sutures removed and antibiotics sent to the pharmacy.

## 2021-04-01 ENCOUNTER — Emergency Department (HOSPITAL_COMMUNITY)
Admission: EM | Admit: 2021-04-01 | Discharge: 2021-04-02 | Disposition: A | Discharge status: Home / Self Care | Payer: Self-pay | Attending: Emergency Medicine | Admitting: Emergency Medicine

## 2021-04-01 ENCOUNTER — Emergency Department (HOSPITAL_COMMUNITY): Payer: Self-pay

## 2021-04-01 ENCOUNTER — Encounter (HOSPITAL_COMMUNITY): Payer: Self-pay

## 2021-04-01 ENCOUNTER — Other Ambulatory Visit: Payer: Self-pay

## 2021-04-01 DIAGNOSIS — F10120 Alcohol abuse with intoxication, uncomplicated: Secondary | ICD-10-CM | POA: Insufficient documentation

## 2021-04-01 DIAGNOSIS — S0081XA Abrasion of other part of head, initial encounter: Secondary | ICD-10-CM | POA: Insufficient documentation

## 2021-04-01 DIAGNOSIS — Y908 Blood alcohol level of 240 mg/100 ml or more: Secondary | ICD-10-CM | POA: Insufficient documentation

## 2021-04-01 DIAGNOSIS — W108XXA Fall (on) (from) other stairs and steps, initial encounter: Secondary | ICD-10-CM | POA: Insufficient documentation

## 2021-04-01 DIAGNOSIS — Z20822 Contact with and (suspected) exposure to covid-19: Secondary | ICD-10-CM | POA: Insufficient documentation

## 2021-04-01 DIAGNOSIS — Y92009 Unspecified place in unspecified non-institutional (private) residence as the place of occurrence of the external cause: Secondary | ICD-10-CM | POA: Insufficient documentation

## 2021-04-01 DIAGNOSIS — W19XXXA Unspecified fall, initial encounter: Secondary | ICD-10-CM

## 2021-04-01 DIAGNOSIS — F10920 Alcohol use, unspecified with intoxication, uncomplicated: Secondary | ICD-10-CM

## 2021-04-01 LAB — ETHANOL: Alcohol, Ethyl (B): 423 mg/dL (ref <10)

## 2021-04-01 LAB — CBC WITH DIFFERENTIAL/PLATELET
Abs Immature Granulocytes: 0.05 10*3/uL (ref 0.00–0.07)
Basophils Absolute: 0.1 10*3/uL (ref 0.0–0.1)
Basophils Relative: 1 %
Eosinophils Absolute: 0.1 10*3/uL (ref 0.0–0.5)
Eosinophils Relative: 1 %
HCT: 39 % (ref 39.0–52.0)
Hemoglobin: 12.9 g/dL — ABNORMAL LOW (ref 13.0–17.0)
Immature Granulocytes: 0 %
Lymphocytes Relative: 33 %
Lymphs Abs: 3.8 10*3/uL (ref 0.7–4.0)
MCH: 34.1 pg — ABNORMAL HIGH (ref 26.0–34.0)
MCHC: 33.1 g/dL (ref 30.0–36.0)
MCV: 103.2 fL — ABNORMAL HIGH (ref 80.0–100.0)
Monocytes Absolute: 0.6 10*3/uL (ref 0.1–1.0)
Monocytes Relative: 5 %
Neutro Abs: 6.9 10*3/uL (ref 1.7–7.7)
Neutrophils Relative %: 60 %
Platelets: 294 10*3/uL (ref 150–400)
RBC: 3.78 MIL/uL — ABNORMAL LOW (ref 4.22–5.81)
RDW: 13.5 % (ref 11.5–15.5)
WBC: 11.4 10*3/uL — ABNORMAL HIGH (ref 4.0–10.5)
nRBC: 0 % (ref 0.0–0.2)

## 2021-04-01 LAB — COMPREHENSIVE METABOLIC PANEL
ALT: 45 U/L — ABNORMAL HIGH (ref 0–44)
AST: 71 U/L — ABNORMAL HIGH (ref 15–41)
Albumin: 3.8 g/dL (ref 3.5–5.0)
Alkaline Phosphatase: 65 U/L (ref 38–126)
Anion gap: 14 (ref 5–15)
BUN: 8 mg/dL (ref 6–20)
CO2: 21 mmol/L — ABNORMAL LOW (ref 22–32)
Calcium: 8.4 mg/dL — ABNORMAL LOW (ref 8.9–10.3)
Chloride: 105 mmol/L (ref 98–111)
Creatinine, Ser: 0.95 mg/dL (ref 0.61–1.24)
GFR, Estimated: >60 mL/min (ref >60)
Glucose, Bld: 98 mg/dL (ref 70–99)
Potassium: 3.6 mmol/L (ref 3.5–5.1)
Sodium: 140 mmol/L (ref 135–145)
Total Bilirubin: 0.5 mg/dL (ref 0.3–1.2)
Total Protein: 6.6 g/dL (ref 6.5–8.1)

## 2021-04-01 LAB — RESP PANEL BY RT-PCR (FLU A&B, COVID) ARPGX2
Influenza A by PCR: NEGATIVE
Influenza B by PCR: NEGATIVE
SARS Coronavirus 2 by RT PCR: NEGATIVE
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
## 2021-04-01 NOTE — Discharge Instructions (Signed)
Follow-up with your PCP.
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
## 2021-04-06 ENCOUNTER — Other Ambulatory Visit: Payer: Self-pay

## 2021-04-06 ENCOUNTER — Emergency Department (HOSPITAL_COMMUNITY): Payer: Self-pay

## 2021-04-06 ENCOUNTER — Encounter (HOSPITAL_COMMUNITY): Payer: Self-pay

## 2021-04-06 ENCOUNTER — Emergency Department (HOSPITAL_COMMUNITY)
Admission: EM | Admit: 2021-04-06 | Discharge: 2021-04-06 | Disposition: A | Discharge status: Home / Self Care | Payer: Self-pay | Attending: Emergency Medicine | Admitting: Emergency Medicine

## 2021-04-06 DIAGNOSIS — S060XAD Concussion with loss of consciousness status unknown, subsequent encounter: Secondary | ICD-10-CM | POA: Insufficient documentation

## 2021-04-06 DIAGNOSIS — H748X2 Other specified disorders of left middle ear and mastoid: Secondary | ICD-10-CM | POA: Insufficient documentation

## 2021-04-06 DIAGNOSIS — H6592 Unspecified nonsuppurative otitis media, left ear: Secondary | ICD-10-CM

## 2021-04-06 DIAGNOSIS — H7291 Unspecified perforation of tympanic membrane, right ear: Secondary | ICD-10-CM | POA: Insufficient documentation

## 2021-04-06 MED ORDER — CIPROFLOXACIN-DEXAMETHASONE 0.3-0.1 % OT SUSP
4.0000 [drp] | Freq: Once | OTIC | Status: AC
  Administered 2021-04-06: 4 [drp] via OTIC
  Filled 2021-04-06: qty 7.5

## 2021-04-06 MED ORDER — FLUTICASONE PROPIONATE 50 MCG/ACT NA SUSP
2.0000 | Freq: Once | NASAL | Status: AC
  Administered 2021-04-06: 2 via NASAL
  Filled 2021-04-06: qty 16
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
## 2021-04-06 NOTE — Discharge Instructions (Signed)
Here dizziness is likely from multiple causes.  You likely have a concussion and you also have a perforated eardrum on the right and fluid in the left ear.  Please use Ciprodex 4 drops to the right ear twice a day for a week.  Please use Flonase daily for a week to both nostrils  Please call ENT doctor for follow-up for your ruptured eardrum  Return to ER if you have worse dizziness, passing out, headache, vomiting
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
## 2021-06-23 ENCOUNTER — Encounter (HOSPITAL_COMMUNITY): Payer: Self-pay

## 2021-06-23 ENCOUNTER — Ambulatory Visit (HOSPITAL_COMMUNITY)
Admission: EM | Admit: 2021-06-23 | Discharge: 2021-06-23 | Disposition: A | Discharge status: Home / Self Care | Payer: BC Managed Care – PPO | Attending: Family Medicine | Admitting: Family Medicine

## 2021-06-23 DIAGNOSIS — J029 Acute pharyngitis, unspecified: Secondary | ICD-10-CM | POA: Insufficient documentation

## 2021-06-23 DIAGNOSIS — J020 Streptococcal pharyngitis: Secondary | ICD-10-CM | POA: Diagnosis not present

## 2021-06-23 LAB — POCT RAPID STREP A, ED / UC: Streptococcus, Group A Screen (Direct): NEGATIVE

## 2021-06-23 MED ORDER — NYSTATIN 100000 UNIT/ML MT SUSP: 5.0000 mL | Freq: Four times a day (QID) | OROMUCOSAL | 0 refills | Status: AC | PRN

## 2021-06-23 NOTE — ED Triage Notes (Signed)
Pt present sore throat with fever, symptom started on Tuesday. Pt states that he tried otc medication with no relief.  ?

## 2021-06-23 NOTE — ED Provider Notes (Signed)
?[facility] ? ? ?[id number] ?06/23/21 Arrival Time: 1248 ? ?ASSESSMENT & PLAN: ? ?1. Sore throat   ?2. Streptococcal sore throat   ? ?Rapid strep negative. Throat culture sent. ?No signs of peritonsillar abscess. Discussed. ? ?Meds ordered this encounter  ?Medications  ? magic mouthwash (nystatin, lidocaine, diphenhydrAMINE, alum & mag hydroxide) suspension  ?  Sig: Swish and spit 5 mLs 4 (four) times daily as needed for mouth pain.  ?  Dispense:  180 mL  ?  Refill:  0  ? ?Results for orders placed or performed during the hospital encounter of 06/23/21  ?POCT Rapid Strep A (ED/UC)  ?Result Value Ref Range  ? Streptococcus, Group A Screen (Direct) NEGATIVE NEGATIVE  ? ?Labs Reviewed  ?CULTURE, GROUP A STREP [facility])  ?POCT RAPID STREP A, ED / UC  ? ?OTC analgesics and throat care as needed. ? ? ? ?Discharge Instructions   ? ?  ?You may use over the counter ibuprofen or acetaminophen as needed.  ?For a sore throat, over the counter products such as [employer] Peroxyl Mouth Sore Rinse or Chloraseptic Sore Throat Spray may provide some temporary relief. ?Your rapid strep test was negative today. We have sent your throat swab for culture and will let you know of any positive results. ? ? ? ?Reviewed expectations re: course of current medical issues. Questions answered. ?Outlined signs and symptoms indicating need for more acute intervention. ?Patient verbalized understanding. ?After Visit Summary given. ? ? ?SUBJECTIVE: ? ?Christopher Pitts is a 46 y.o. male who reports a sore throat. Painful swallowing. Onset abrupt beginning yesterday. Symptoms have gradually worsened since beginning; without voice changes. No respiratory symptoms. Normal PO intake but reports discomfort with swallowing. No specific alleviating factors. Fever: unsure; is chilled. No neck pain or swelling. No associated nausea, vomiting, or abdominal pain. Known sick contacts: none. Recent travel: none. ?OTC treatment: none  reported. ? ?OBJECTIVE: ? ?Vitals:  ? 06/23/21 1421  ?BP: 126/80  ?Pulse: 68  ?Resp: 18  ?Temp: 99.4 ?F (37.4 ?C)  ?TempSrc: Oral  ?SpO2: 98%  ?  ? ?General appearance: alert; no distress ?HEENT: throat with moderate erythema and cobblestoning; no tonsil exudates; uvula is midline ?Neck: supple with FROM; no lymphadenopathy ?Lungs: speaks full sentences without difficulty; unlabored ?Abd: soft; non-tender ?Skin: reveals no rash; warm and dry ?Psychological: alert and cooperative; normal mood and affect ? ?No Known Allergies ? ?Past Medical History:  ?Diagnosis Date  ? Asthma   ? ?Social History  ? ?Socioeconomic History  ? Marital status: Single  ?  Spouse name: Not on file  ? Number of children: Not on file  ? Years of education: Not on file  ? Highest education level: Not on file  ?Occupational History  ? Not on file  ?Tobacco Use  ? Smoking status: Every Day  ?  Packs/day: 0.50  ?  Types: Cigarettes  ? Smokeless tobacco: Never  ?Substance and Sexual Activity  ? Alcohol use: Yes  ?  Alcohol/week: 7.0 standard drinks  ?  Types: 7 Standard drinks or equivalent per week  ? Drug use: No  ? Sexual activity: Yes  ?Other Topics Concern  ? Not on file  ?Social History Narrative  ? Not on file  ? ?Social Determinants of Health  ? ?Financial Resource Strain: Not on file  ?Food Insecurity: Not on file  ?Transportation Needs: Not on file  ?Physical Activity: Not on file  ?Stress: Not on file  ?Social Connections: Not on file  ?  Intimate Partner Violence: Not on file  ? ?Family History  ?Problem Relation Age of Onset  ? Diabetes Mother   ? Cancer Father   ? ? ? ? ? ? ? ?  ?Mardella Layman, MD ?06/23/21 1545 ? ?

## 2021-06-23 NOTE — Discharge Instructions (Signed)
You may use over the counter ibuprofen or acetaminophen as needed.  For a sore throat, over the counter products such as [employer] Peroxyl Mouth Sore Rinse or Chloraseptic Sore Throat Spray may provide some temporary relief. Your rapid strep test was negative today. We have sent your throat swab for culture and will let you know of any positive results. 

## 2021-06-25 LAB — CULTURE, GROUP A STREP (THRC)

## 2021-06-28 ENCOUNTER — Telehealth: Payer: Self-pay

## 2021-06-28 MED ORDER — PENICILLIN V POTASSIUM 500 MG PO TABS: 500.0000 mg | ORAL_TABLET | Freq: Two times a day (BID) | ORAL | 0 refills | Status: AC

## 2022-06-10 IMAGING — CT CT HEAD W/O CM
4 series · 15 of 47 positions shown, 17 images · non-contrast
Comparison: Head CT dated 06/30/2009.

CLINICAL DATA: Trauma.



[Series 1: head without · axial · non-contrast · 0.44mm/px · z∈[-4,+116]mm · 7 of 34 slices shown, 9 images]
[im 5/34  brain]
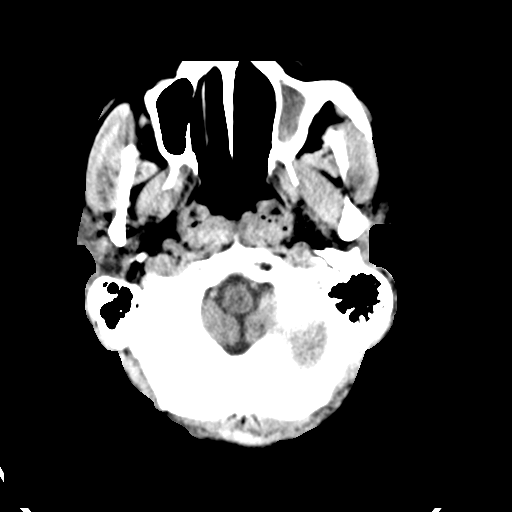
[im 5/34  bone]
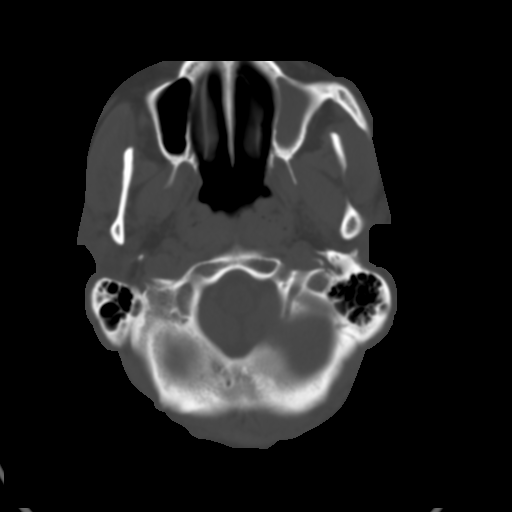
[im 9/34  brain]
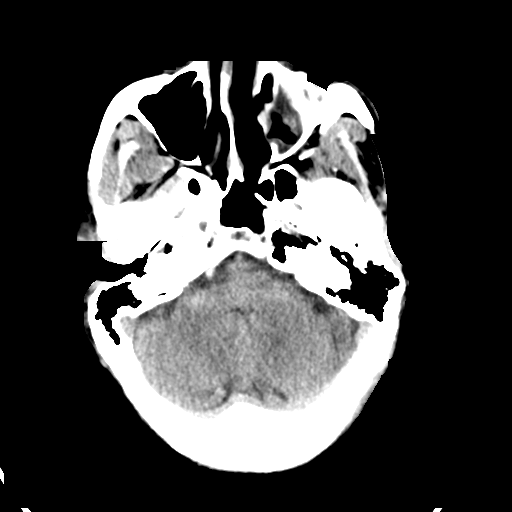
[im 13/34  brain]
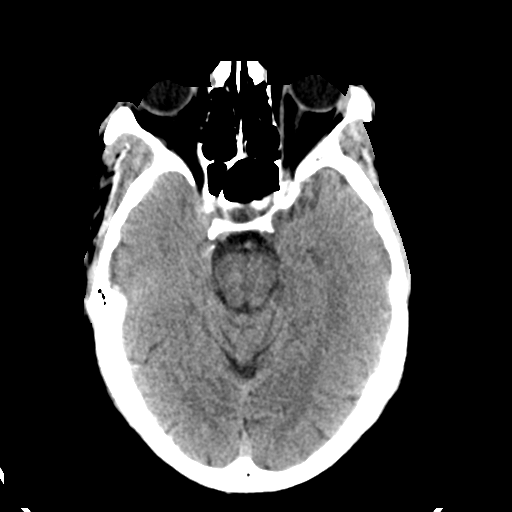
[im 17/34  brain]
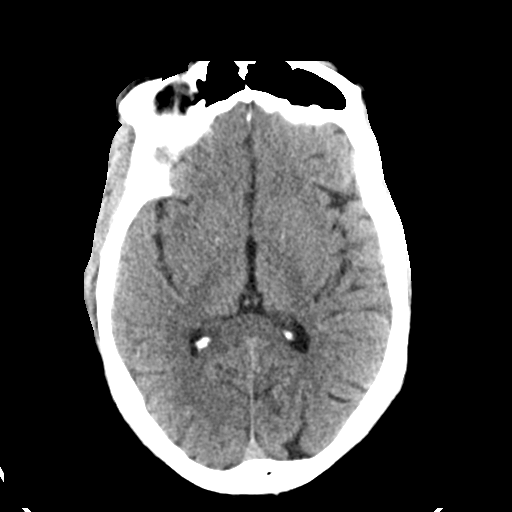
[im 21/34  brain]
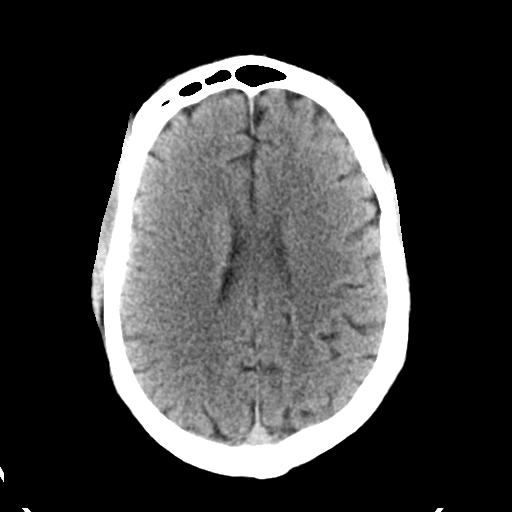
[im 21/34  bone]
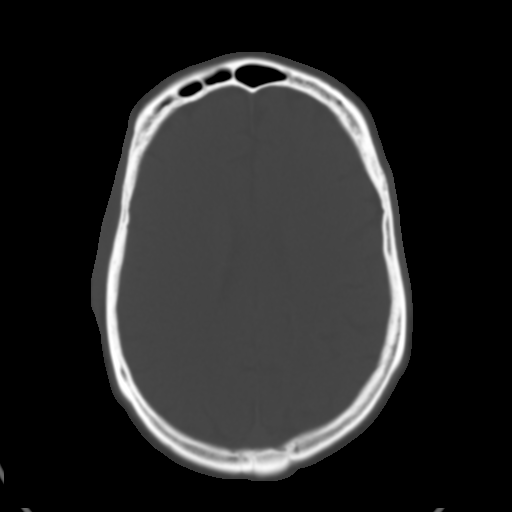
[im 25/34  brain]
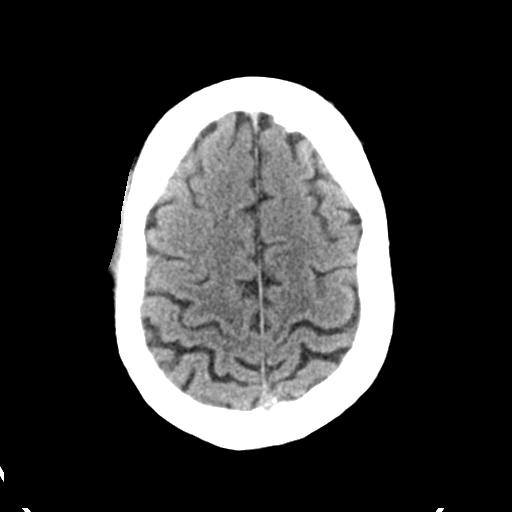
[im 29/34  brain]
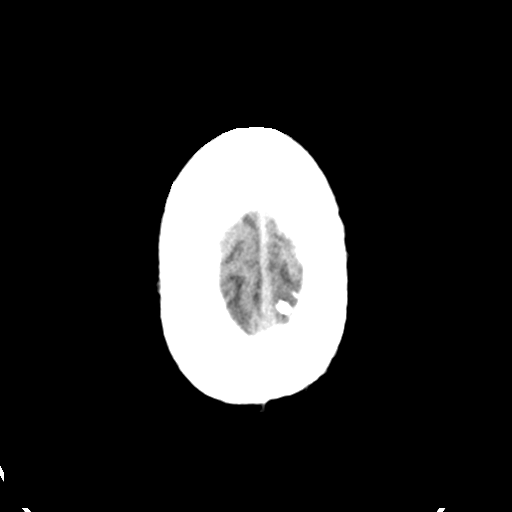

[Series 4: head bone · axial · 0.44mm/px · z∈[-8,+8]mm · 2 of 84 slices shown]
[im 9/84  bone]
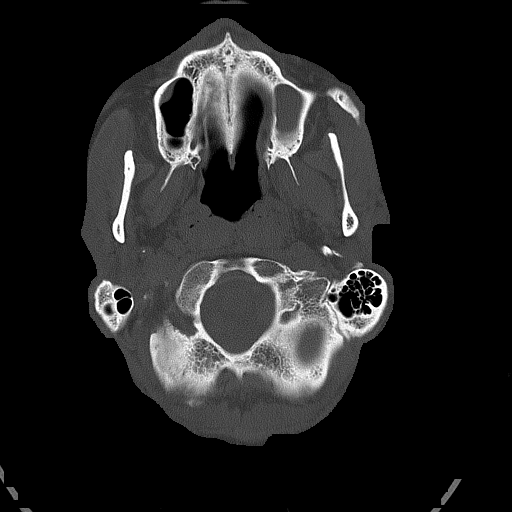
[im 17/84  bone]
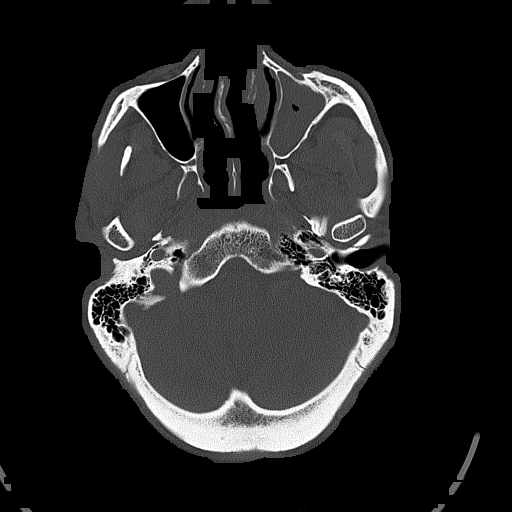

[Series 5: head without cor · coronal · non-contrast · 0.33mm/px · 3 of 74 slices shown]
[im 25/74  brain]
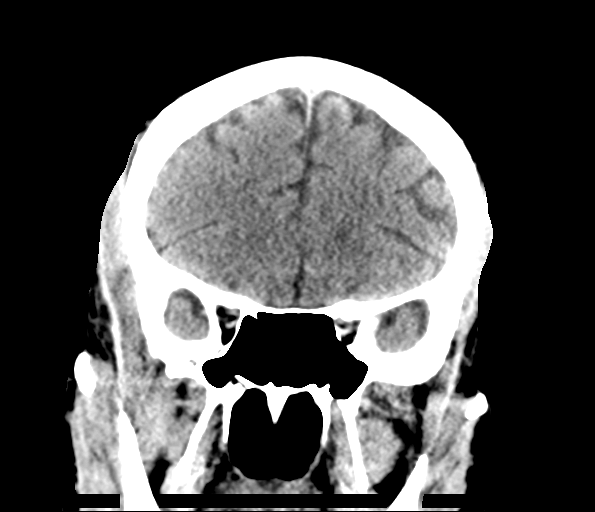
[im 33/74  brain]
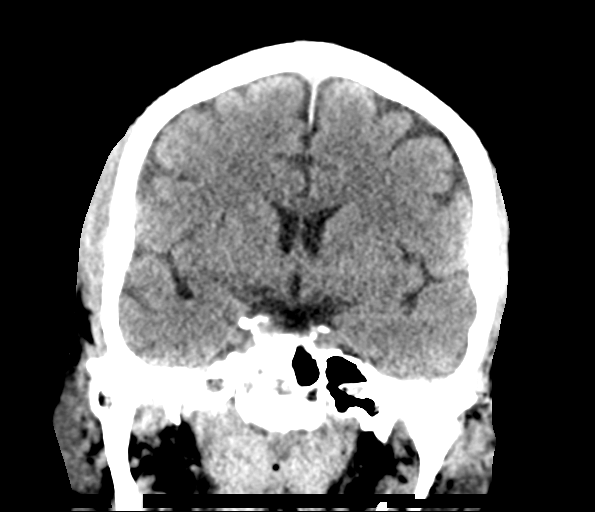
[im 41/74  brain]
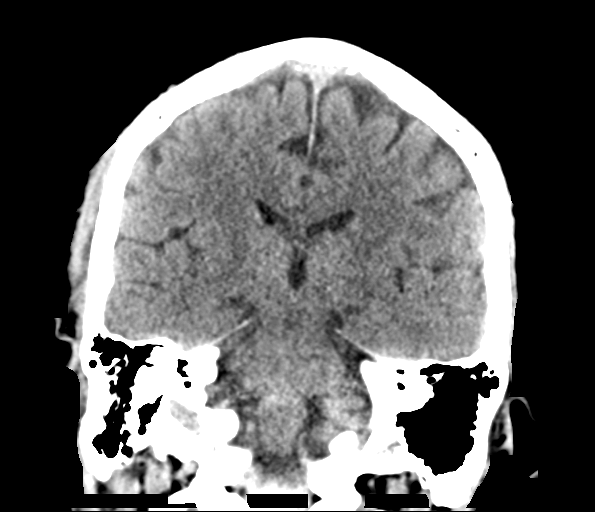

[Series 6: head without sag · sagittal · non-contrast · 0.34mm/px · 3 of 67 slices shown]
[im 23/67  brain]
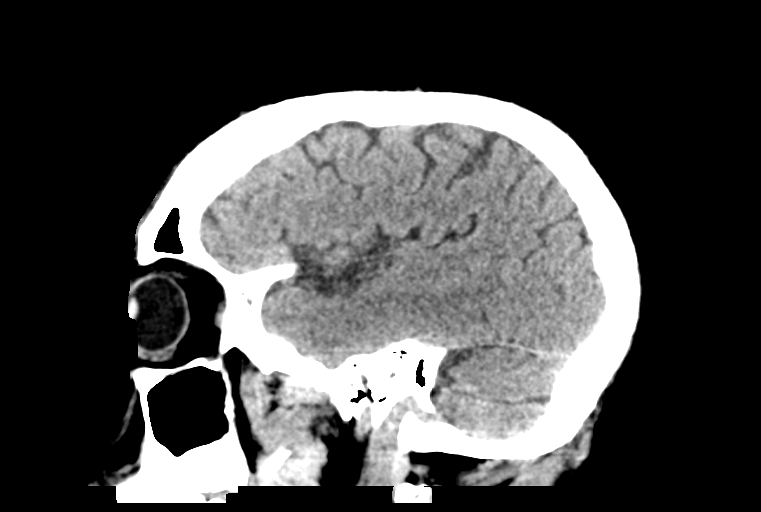
[im 34/67  brain]
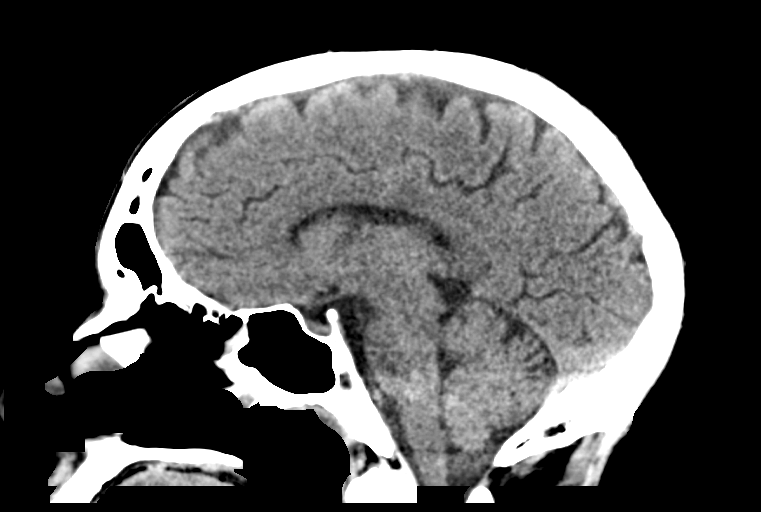
[im 45/67  brain]
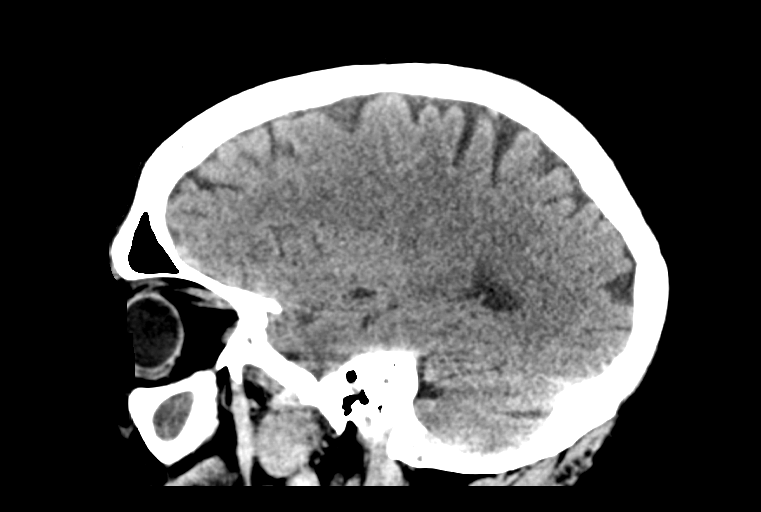

[15 of 47 positions shown; findings below may reference images not displayed]

FINDINGS: CT HEAD FINDINGS

Brain: The ventricles and sulci are appropriate size for the
patient's age. The gray-white matter discrimination is preserved.
Small hypodense focus in the left insula ([DATE]) may represent a
dilated perivascular space and less likely a small old lacunar
infarct. There is no acute intracranial hemorrhage. No mass effect
or midline shift. No extra-axial fluid collection.

Vascular: No hyperdense vessel or unexpected calcification.

Skull: Normal. Negative for fracture or focal lesion.

Other: None

CT MAXILLOFACIAL FINDINGS

Osseous: No acute fracture. No mandibular subluxation. Multiple
dental caries and periapical lucencies.

Orbits: The globes and retro-orbital fat are preserved.

Sinuses: There is opacification of the left maxillary sinus. No
air-fluid level. Mastoid air cells are clear.

Soft tissues: Mild subcutaneous contusion over the chain. No
hematoma or fluid collection.

CT CERVICAL SPINE FINDINGS

Alignment: Normal.

Skull base and vertebrae: No acute fracture. No primary bone lesion
or focal pathologic process.

Soft tissues and spinal canal: No prevertebral fluid or swelling. No
visible canal hematoma.

Disc levels: No acute findings. No significant degenerative changes.

Upper chest: Negative.

Other: None
IMPRESSION: 1. No acute intracranial pathology.
2. No acute facial bone fractures.
3. No acute cervical spine pathology.

## 2022-06-10 IMAGING — CT CT MAXILLOFACIAL W/O CM
3 series · 15 of 47 positions shown, 18 images · non-contrast
Comparison: Head CT dated 06/30/2009.

CLINICAL DATA: Trauma.



[Series 1: facialbone 2.0 st · axial · 0.43mm/px · z∈[-88,+72]mm · 9 of 94 slices shown, 12 images]
[im 7/94  brain]
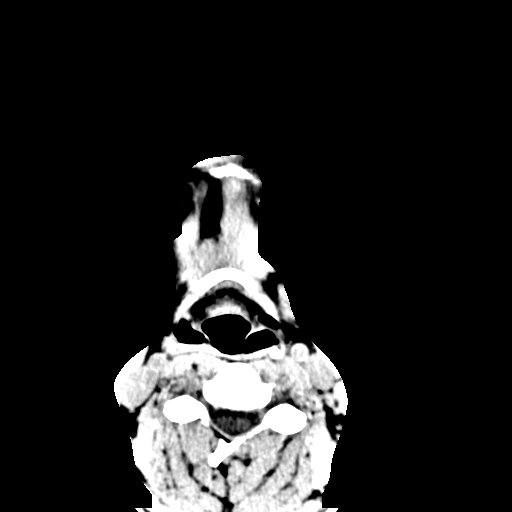
[im 7/94  bone]
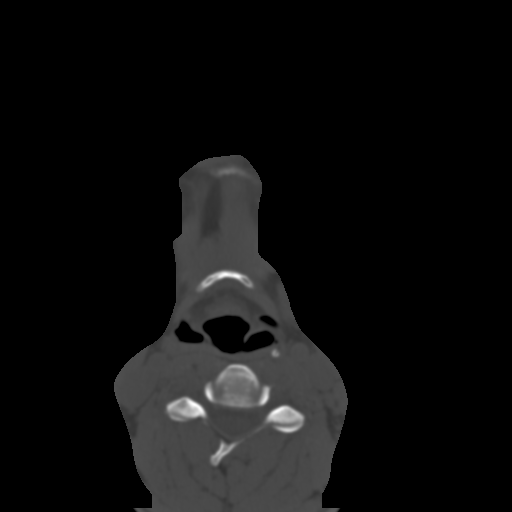
[im 17/94  bone]
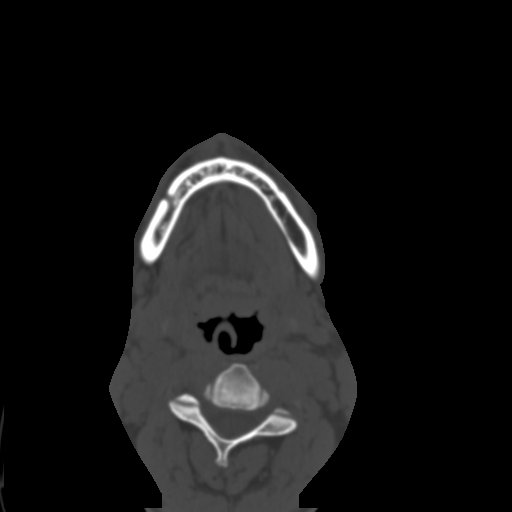
[im 26/94  bone]
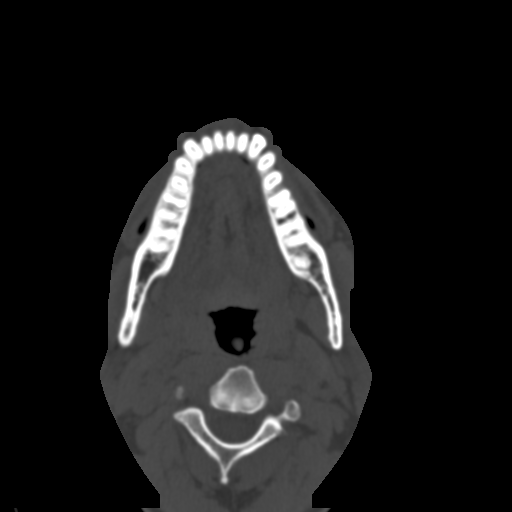
[im 36/94  bone]
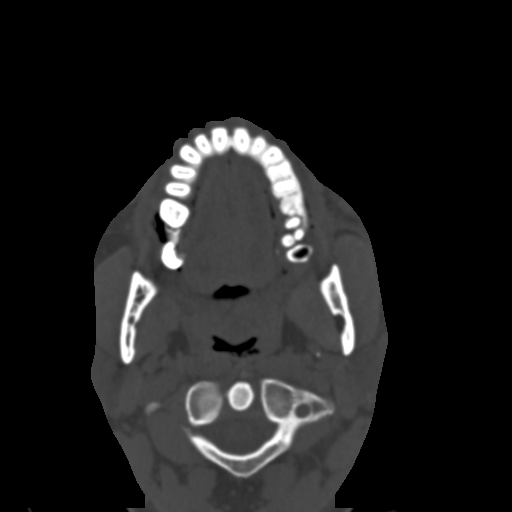
[im 49/94  brain]
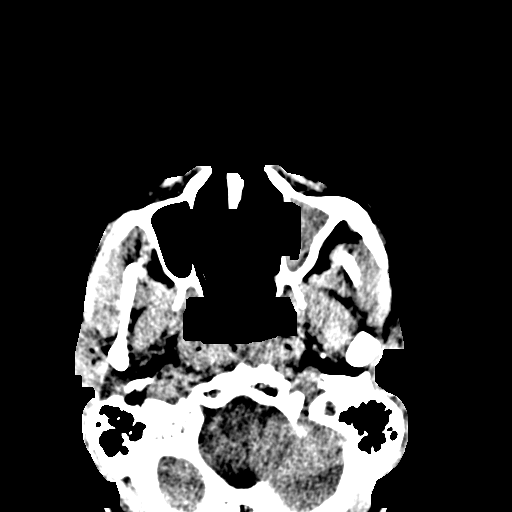
[im 49/94  bone]
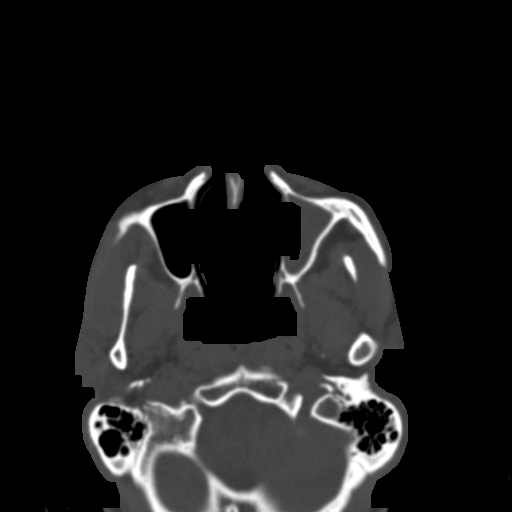
[im 58/94  bone]
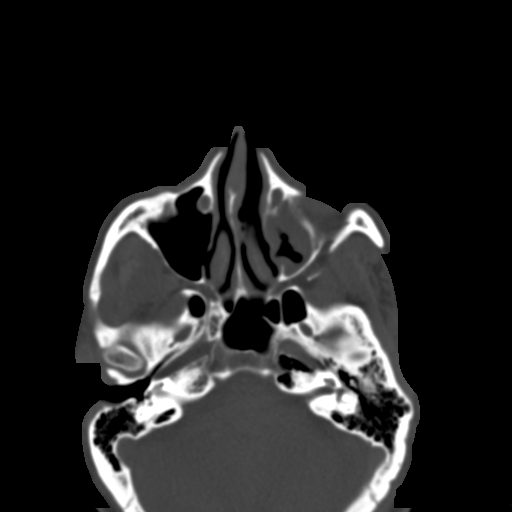
[im 68/94  bone]
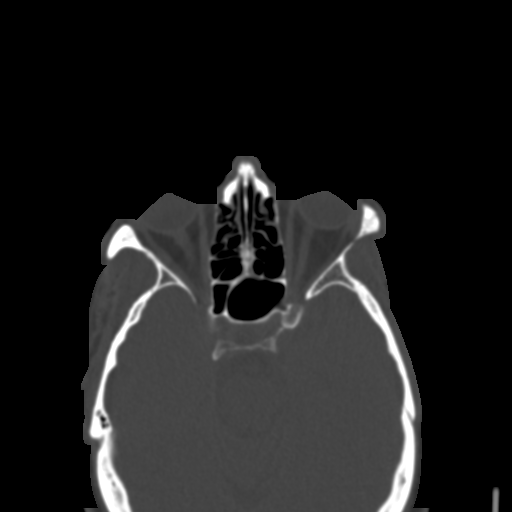
[im 77/94  bone]
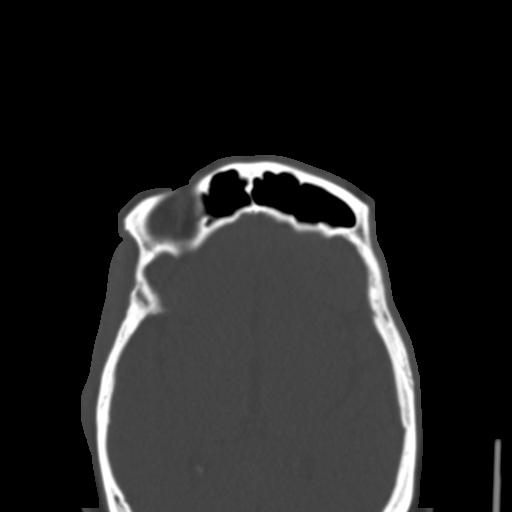
[im 87/94  brain]
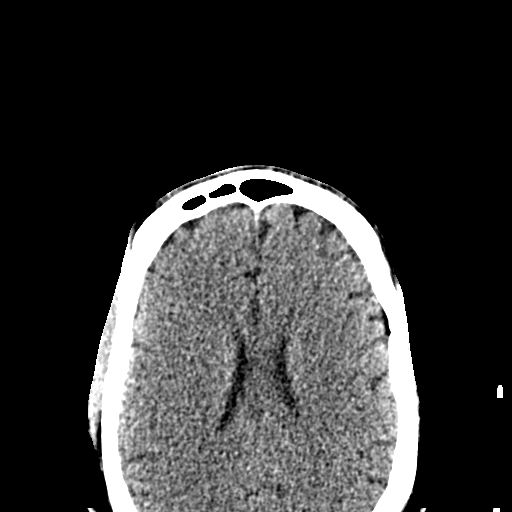
[im 87/94  bone]
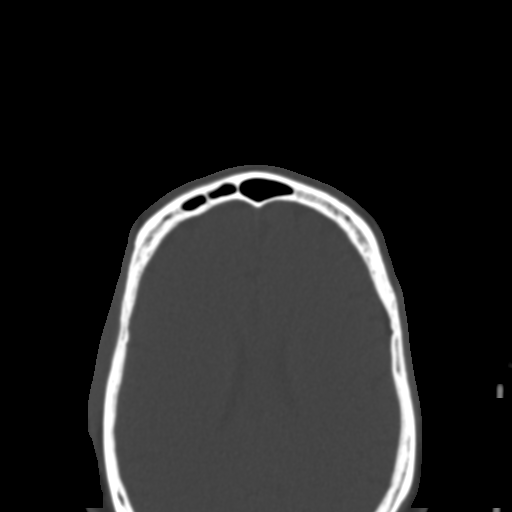

[Series 9: facialbone 2.0 cor st · coronal · 0.36mm/px · 3 of 93 slices shown]
[im 31/93  bone]
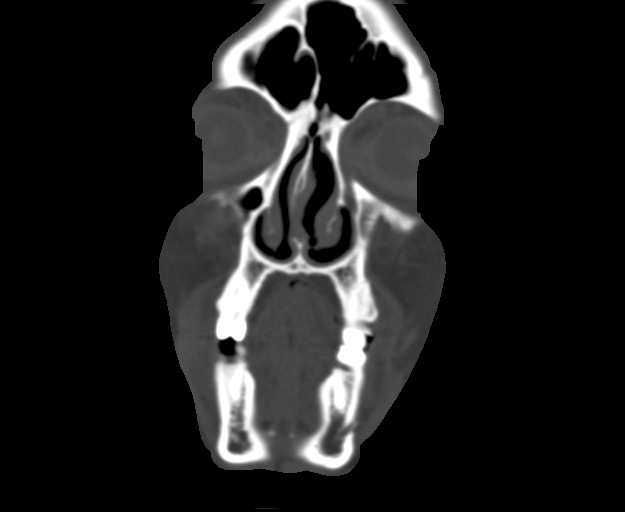
[im 41/93  bone]
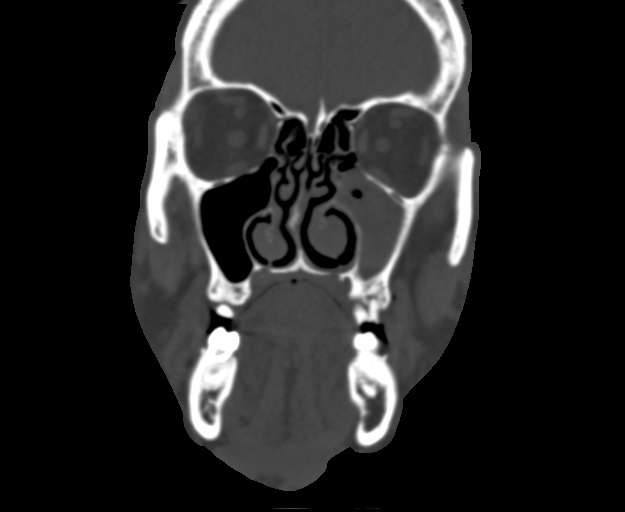
[im 52/93  bone]
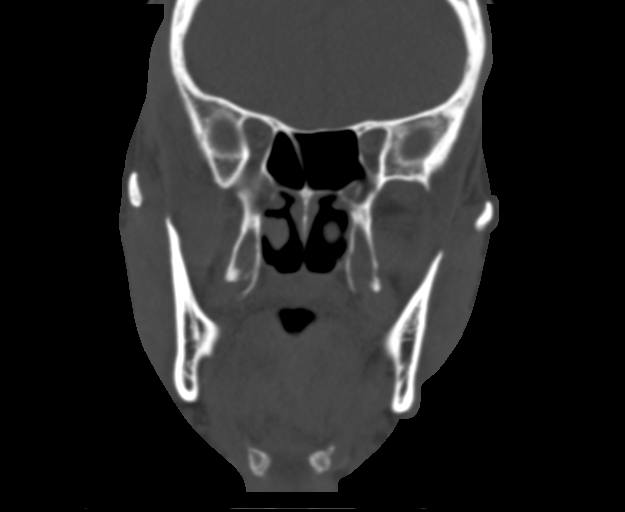

[Series 10: facialbone 2.0 sag st · sagittal · 0.38mm/px · 3 of 98 slices shown]
[im 33/98  bone]
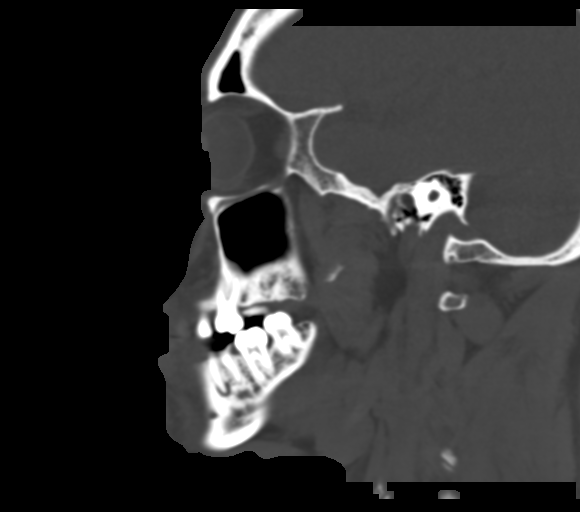
[im 49/98  bone]
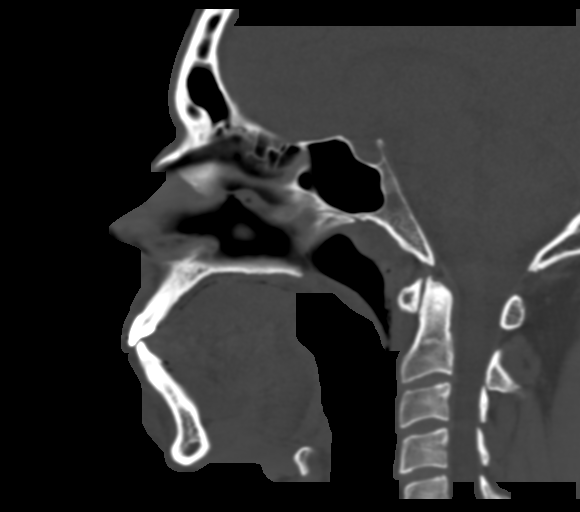
[im 65/98  bone]
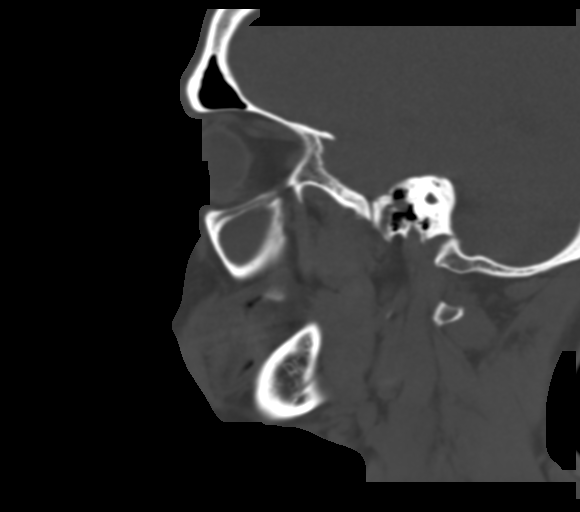

[15 of 47 positions shown; findings below may reference images not displayed]

FINDINGS: CT HEAD FINDINGS

Brain: The ventricles and sulci are appropriate size for the
patient's age. The gray-white matter discrimination is preserved.
Small hypodense focus in the left insula ([DATE]) may represent a
dilated perivascular space and less likely a small old lacunar
infarct. There is no acute intracranial hemorrhage. No mass effect
or midline shift. No extra-axial fluid collection.

Vascular: No hyperdense vessel or unexpected calcification.

Skull: Normal. Negative for fracture or focal lesion.

Other: None

CT MAXILLOFACIAL FINDINGS

Osseous: No acute fracture. No mandibular subluxation. Multiple
dental caries and periapical lucencies.

Orbits: The globes and retro-orbital fat are preserved.

Sinuses: There is opacification of the left maxillary sinus. No
air-fluid level. Mastoid air cells are clear.

Soft tissues: Mild subcutaneous contusion over the chain. No
hematoma or fluid collection.

CT CERVICAL SPINE FINDINGS

Alignment: Normal.

Skull base and vertebrae: No acute fracture. No primary bone lesion
or focal pathologic process.

Soft tissues and spinal canal: No prevertebral fluid or swelling. No
visible canal hematoma.

Disc levels: No acute findings. No significant degenerative changes.

Upper chest: Negative.

Other: None
IMPRESSION: 1. No acute intracranial pathology.
2. No acute facial bone fractures.
3. No acute cervical spine pathology.

## 2022-07-29 ENCOUNTER — Other Ambulatory Visit: Payer: Self-pay

## 2022-07-29 ENCOUNTER — Emergency Department (HOSPITAL_COMMUNITY)
Admission: EM | Admit: 2022-07-29 | Discharge: 2022-07-29 | Disposition: A | Discharge status: Home / Self Care | Payer: Medicaid Other | Attending: Emergency Medicine | Admitting: Emergency Medicine

## 2022-07-29 ENCOUNTER — Encounter (HOSPITAL_COMMUNITY): Payer: Self-pay

## 2022-07-29 DIAGNOSIS — J45909 Unspecified asthma, uncomplicated: Secondary | ICD-10-CM | POA: Diagnosis not present

## 2022-07-29 DIAGNOSIS — K644 Residual hemorrhoidal skin tags: Secondary | ICD-10-CM | POA: Insufficient documentation

## 2022-07-29 DIAGNOSIS — K611 Rectal abscess: Secondary | ICD-10-CM

## 2022-07-29 MED ORDER — OXYCODONE-ACETAMINOPHEN 5-325 MG PO TABS
2.0000 | ORAL_TABLET | Freq: Once | ORAL | Status: AC
  Administered 2022-07-29: 2 via ORAL
  Filled 2022-07-29: qty 2

## 2022-07-29 MED ORDER — DOXYCYCLINE HYCLATE 100 MG PO CAPS: 100.0000 mg | ORAL_CAPSULE | Freq: Two times a day (BID) | ORAL | 0 refills | Status: AC

## 2022-07-29 MED ORDER — LIDOCAINE HCL 2 % IJ SOLN
10.0000 mL | Freq: Once | INTRAMUSCULAR | Status: AC
  Administered 2022-07-29: 200 mg
  Filled 2022-07-29: qty 20

## 2022-07-29 NOTE — ED Provider Notes (Signed)
[place] [facility]ENCY DEPARTMENT AT [facility] Provider Note   CSN: [id number] Arrival date & time: 07/29/22  6213     History  Chief Complaint  Patient presents with   Boil on Buttocks    Christopher Pitts is a 47 y.o. male with history of asthma and psoriasis who presents to the ER complaining of a boil on his buttocks for the past 2 days. States he is unable to sit due to pain. Reports it is in the same place he has had one before. No fevers or chills.   HPI     Home Medications Prior to Admission medications   Medication Sig Start Date End Date Taking? Authorizing Provider  doxycycline (VIBRAMYCIN) 100 MG capsule Take 1 capsule (100 mg total) by mouth 2 (two) times daily. 07/29/22  Yes Branae Crail T, PA-C  magic mouthwash (nystatin, lidocaine, diphenhydrAMINE, alum & mag hydroxide) suspension Swish and spit 5 mLs 4 (four) times daily as needed for mouth pain. 06/23/21   Mardella Layman, MD      Allergies    Patient has no known allergies.    Review of Systems   Review of Systems  Constitutional:  Negative for chills and fever.  Skin:        Abscess  All other systems reviewed and are negative.   Physical Exam Updated Vital Signs BP 116/77   Pulse 97   Temp 98.5 F (36.9 C) (Oral)   Resp 20   Ht 6\' 1"  (1.854 m)   Wt 70.3 kg   SpO2 97%   BMI 20.45 kg/m  Physical Exam Vitals and nursing note reviewed. Exam conducted with a chaperone present.  Constitutional:      Appearance: Normal appearance.  HENT:     Head: Normocephalic and atraumatic.  Eyes:     Conjunctiva/sclera: Conjunctivae normal.  Pulmonary:     Effort: Pulmonary effort is normal. No respiratory distress.  Genitourinary:    Rectum: External hemorrhoid present.     Comments: Area of induration about 3 cm in diameter with about 1 cm of fluctuance, just 1 cm lateral (right) of the anus, small amount of purulent drainage Skin:    General: Skin is warm and dry.  Neurological:      Mental Status: He is alert.  Psychiatric:        Mood and Affect: Mood normal.        Behavior: Behavior normal.     ED Results / Procedures / Treatments   Labs (all labs ordered are listed, but only abnormal results are displayed) Labs Reviewed - No data to display  EKG None  Radiology No results found.  Procedures .Marland KitchenIncision and Drainage  Date/Time: 07/29/2022 12:59 PM  Performed by: Su Monks, PA-C Authorized by: Su Monks, PA-C   Consent:    Consent obtained:  Verbal   Consent given by:  Patient   Risks discussed:  Bleeding, incomplete drainage, pain and damage to other organs   Alternatives discussed:  No treatment Universal protocol:    Procedure explained and questions answered to patient or proxy's satisfaction: yes     Relevant documents present and verified: yes     Test results available : yes     Imaging studies available: yes     Required blood products, implants, devices, and special equipment available: yes     Site/side marked: yes     Immediately prior to procedure, a time out was called: yes  Patient identity confirmed:  Verbally with patient Location:    Type:  Abscess   Size:  3 cm   Location:  Anogenital   Anogenital location:  Perirectal Pre-procedure details:    Skin preparation:  Chlorhexidine with alcohol and povidone-iodine Sedation:    Sedation type:  None Anesthesia:    Anesthesia method:  Local infiltration   Local anesthetic:  Lidocaine 2% w/o epi Procedure type:    Complexity:  Complex Procedure details:    Ultrasound guidance: no     Needle aspiration: yes     Needle size:  25 G   Incision types:  Single straight   Incision depth:  Subcutaneous   Wound management:  Probed and deloculated, irrigated with saline and extensive cleaning   Drainage:  Purulent   Drainage amount:  Moderate   Wound treatment:  Wound left open   Packing materials:  1/4 in gauze   Amount 1/4":  6 cm Post-procedure details:     Procedure completion:  Tolerated well, no immediate complications     Medications Ordered in ED Medications  oxyCODONE-acetaminophen (PERCOCET/ROXICET) 5-325 MG per tablet 2 tablet (2 tablets Oral Given 07/29/22 1158)  lidocaine (XYLOCAINE) 2 % (with pres) injection 200 mg (200 mg Infiltration Given 07/29/22 1158)    ED Course/ Medical Decision Making/ A&P                             Medical Decision Making  Patient is a 47 y.o. male who presents to the emergency department for an abscess.  Physical exam: Area of induration about 3 cm in diameter with small amount of purulent drainage, surrounding erythema, about 1 cm lateral of the anus. Normal vital signs, clinically well appearing. Does not meet SIRS.   Procedure: Abscess was incised and drained. Area was anesthestized with lidocaine w/o epi. Patient tolerated procedure well with no immediate complications.   Disposition: Patient is not requiring admission or inpatient treatment further symptoms. Considered imaging and laboratory evaluation due to area of abscess, but after chart review, patient had thorough workup when he presented with similar abscess  a few years ago and had reassuring CT at that time. Based on description of abscess, the one he has today appears less complex. Patient was placed on course of antibiotics and instructed to have a wound check in 3 days.  We discussed reasons to return to the emergency department, and patient is agreeable to the plan.  Final Clinical Impression(s) / ED Diagnoses Final diagnoses:  Perirectal abscess    Rx / DC Orders ED Discharge Orders          Ordered    doxycycline (VIBRAMYCIN) 100 MG capsule  2 times daily        07/29/22 1255           Portions of this report may have been transcribed using voice recognition software. Every effort was made to ensure accuracy; however, inadvertent computerized transcription errors may be present.    Jeanella Flattery 07/29/22  1302    Terald Sleeper, MD 07/29/22 1705

## 2022-07-29 NOTE — ED Triage Notes (Signed)
Pt arrived POV. C/O boil on buttocks x 2 days. Pt unable to sit in triage d/t pain.

## 2022-07-29 NOTE — Discharge Instructions (Addendum)
You were seen in the emergency department for an abscess.  We have drained the area and cleaned it. I would like you to have the wound checked in 2-3 days. This can be done by any doctor's office, urgent care, or emergency department. This is to make sure the area hasn't closed too soon. Try to keep the area as clean and dry as possible. It is okay to let warm soapy water run over the area, but do NOT scrub the area.   If the packing comes out you can go ahead and remove it, do not put it back in yourself.   I am placing you on a course of antibiotics. It is important you finish the entire course! You can take ibuprofen or tylenol as needed for pain.   You can use an antiseptic (chlorhexidine) soap from the pharmacy 1-2 x per month in the areas where abscesses are most likely to form (armpits, buttocks, groin). This soap can dry your skin out so use it sparingly. Once do so once this area has fully healed.   Continue to monitor how you're doing and return to the ER for new or worsening symptoms.

## 2022-08-02 ENCOUNTER — Ambulatory Visit (HOSPITAL_COMMUNITY)
Admission: EM | Admit: 2022-08-02 | Discharge: 2022-08-02 | Disposition: A | Discharge status: Home / Self Care | Payer: Medicaid Other | Attending: Physician Assistant | Admitting: Physician Assistant

## 2022-08-02 ENCOUNTER — Encounter (HOSPITAL_COMMUNITY): Payer: Self-pay

## 2022-08-02 DIAGNOSIS — Z5189 Encounter for other specified aftercare: Secondary | ICD-10-CM

## 2022-08-02 DIAGNOSIS — K611 Rectal abscess: Secondary | ICD-10-CM

## 2022-08-02 MED ORDER — AMOXICILLIN-POT CLAVULANATE 875-125 MG PO TABS: 1.0000 | ORAL_TABLET | Freq: Two times a day (BID) | ORAL | 0 refills | Status: AC

## 2022-08-02 NOTE — ED Triage Notes (Signed)
Here for wound check on buttocks. Denies any drainage or pain at this time.

## 2022-08-02 NOTE — ED Provider Notes (Signed)
[facility]    CSN: [id number] Arrival date & time: 08/02/22  1518      History   Chief Complaint No chief complaint on file.   HPI Christopher Pitts is a 47 y.o. male.   Patient presents today for wound check.  He was seen in the emergency room on 07/29/2022 at which point perirectal abscess was drained successfully.  He was started on doxycycline and has been taking medication as prescribed.  He reports that symptoms have been improving and currently pain is rated 1/2 on a 0-10 pain scale.  He is able to sit but does occasionally have some discomfort.  Denies any fever, nausea, vomiting.  He has had episodes of similar symptoms in the past in the same area about 2 years ago.  He has never seen a surgeon to have underlying issue addressed but is open to this given recurrent symptoms.  The wound was initially packed when he was in the emergency room but this has since fallen out.  He has been keeping area clean but has not been applying warm compresses or soaks.  Reports he is feeling much better today.    Past Medical History:  Diagnosis Date   Asthma     There are no problems to display for this patient.   History reviewed. No pertinent surgical history.     Home Medications    Prior to Admission medications   Medication Sig Start Date End Date Taking? Authorizing Provider  amoxicillin-clavulanate (AUGMENTIN) 875-125 MG tablet Take 1 tablet by mouth every 12 (twelve) hours. 08/02/22  Yes Preciliano Castell, Denny Peon K, PA-C  doxycycline (VIBRAMYCIN) 100 MG capsule Take 1 capsule (100 mg total) by mouth 2 (two) times daily. 07/29/22   Roemhildt, Lorin T, PA-C  magic mouthwash (nystatin, lidocaine, diphenhydrAMINE, alum & mag hydroxide) suspension Swish and spit 5 mLs 4 (four) times daily as needed for mouth pain. 06/23/21   Mardella Layman, MD    Family History Family History  Problem Relation Age of Onset   Diabetes Mother    Cancer Father     Social History Social History    Tobacco Use   Smoking status: Every Day    Packs/day: .5    Types: Cigarettes   Smokeless tobacco: Never  Substance Use Topics   Alcohol use: Yes    Alcohol/week: 7.0 standard drinks of alcohol    Types: 7 Standard drinks or equivalent per week   Drug use: No     Allergies   Patient has no known allergies.   Review of Systems Review of Systems  Constitutional:  Negative for activity change, appetite change, fatigue and fever.  Gastrointestinal:  Negative for abdominal pain, diarrhea, nausea and vomiting.  Musculoskeletal:  Negative for arthralgias and myalgias.  Skin:  Positive for wound.     Physical Exam Triage Vital Signs ED Triage Vitals [08/02/22 1609]  Enc Vitals Group     BP 124/77     Pulse Rate 77     Resp 18     Temp 98.1 F (36.7 C)     Temp Source Oral     SpO2 98 %     Weight      Height      Head Circumference      Peak Flow      Pain Score      Pain Loc      Pain Edu?      Excl. in GC?    No data  found.  Updated Vital Signs BP 124/77 (BP Location: Left Arm)   Pulse 77   Temp 98.1 F (36.7 C) (Oral)   Resp 18   SpO2 98%   Visual Acuity Right Eye Distance:   Left Eye Distance:   Bilateral Distance:    Right Eye Near:   Left Eye Near:    Bilateral Near:     Physical Exam Vitals reviewed.  Constitutional:      General: He is awake.     Appearance: Normal appearance. He is well-developed. He is not ill-appearing.     Comments: Very pleasant male appears stated age in no acute distress sitting comfortably in exam room  HENT:     Head: Normocephalic and atraumatic.  Cardiovascular:     Rate and Rhythm: Normal rate and regular rhythm.     Heart sounds: Normal heart sounds, S1 normal and S2 normal. No murmur heard. Pulmonary:     Effort: Pulmonary effort is normal.     Breath sounds: Normal breath sounds. No stridor. No wheezing, rhonchi or rales.     Comments: Clear to auscultation bilaterally Abdominal:     Palpations:  Abdomen is soft.     Tenderness: There is no abdominal tenderness.  Genitourinary:      Comments: 2 cm x 2 cm wound with associated induration and active purulent drainage. Neurological:     Mental Status: He is alert.  Psychiatric:        Behavior: Behavior is cooperative.      UC Treatments / Results  Labs (all labs ordered are listed, but only abnormal results are displayed) Labs Reviewed - No data to display  EKG   Radiology No results found.  Procedures Procedures (including critical care time)  Medications Ordered in UC Medications - No data to display  Initial Impression / Assessment and Plan / UC Course  I have reviewed the triage vital signs and the nursing notes.  Pertinent labs & imaging results that were available during my care of the patient were reviewed by me and considered in my medical decision making (see chart for details).     Patient is well-appearing, afebrile, nontoxic, nontachycardic.  Packing was not replaced as this is already fallen out and appears to be healing appropriately.  He does continue to have some drainage.  He was encouraged to continue doxycycline will also cover with Augmentin.  5 days of Augmentin was sent to pharmacy in addition to previously prescribed Doxy.  Encouraged him to keep area clean and use warm compresses/sitz bath to encourage any ongoing drainage.  We did discuss potential utility of seeing a surgeon to address underlying issue and he was given contact information for local provider with instruction to call to schedule an appointment as needed.  If he has any worsening symptoms including recurrent lesion, increasing pain, fever, nausea, vomiting he needs to be seen immediately.  Strict return precautions given to which patient expressed understanding.  Final Clinical Impressions(s) / UC Diagnoses   Final diagnoses:  Perirectal abscess  Wound check, abscess     Discharge Instructions      This appears to be  healing well.  Finish the doxycycline that you are prescribed.  Add Augmentin twice daily for 5 days.  Keep this area clean with soap and water and use sitz bath's/warm compresses to help encourage drainage.  It is reasonable to follow-up with surgery given recurrent symptoms.  Call them to schedule an appointment.  If anything worsens and you  have increasing pain, swelling of lesion, fever, nausea, vomiting you should be seen immediately.     ED Prescriptions     Medication Sig Dispense Auth. Provider   amoxicillin-clavulanate (AUGMENTIN) 875-125 MG tablet Take 1 tablet by mouth every 12 (twelve) hours. 10 tablet Marine Lezotte, Noberto Retort, PA-C      PDMP not reviewed this encounter.   Jeani Hawking, PA-C 08/02/22 1625

## 2022-08-02 NOTE — Discharge Instructions (Addendum)
This appears to be healing well.  Finish the doxycycline that you are prescribed.  Add Augmentin twice daily for 5 days.  Keep this area clean with soap and water and use sitz bath's/warm compresses to help encourage drainage.  It is reasonable to follow-up with surgery given recurrent symptoms.  Call them to schedule an appointment.  If anything worsens and you have increasing pain, swelling of lesion, fever, nausea, vomiting you should be seen immediately.

## 2023-07-21 ENCOUNTER — Emergency Department (HOSPITAL_COMMUNITY)
Admission: EM | Admit: 2023-07-21 | Discharge: 2023-07-22 | Disposition: A | Discharge status: Home / Self Care | Attending: Emergency Medicine | Admitting: Emergency Medicine

## 2023-07-21 ENCOUNTER — Encounter (HOSPITAL_COMMUNITY): Payer: Self-pay | Admitting: Emergency Medicine

## 2023-07-21 ENCOUNTER — Ambulatory Visit (HOSPITAL_COMMUNITY)
Admission: EM | Admit: 2023-07-21 | Discharge: 2023-07-21 | Disposition: A | Discharge status: Home / Self Care | Attending: Emergency Medicine | Admitting: Emergency Medicine

## 2023-07-21 ENCOUNTER — Ambulatory Visit (HOSPITAL_COMMUNITY): Admission: EM | Admit: 2023-07-21 | Discharge: 2023-07-21 | Discharge status: Against Medical Advice

## 2023-07-21 ENCOUNTER — Ambulatory Visit (INDEPENDENT_AMBULATORY_CARE_PROVIDER_SITE_OTHER)

## 2023-07-21 ENCOUNTER — Other Ambulatory Visit: Payer: Self-pay

## 2023-07-21 ENCOUNTER — Encounter (HOSPITAL_COMMUNITY): Payer: Self-pay | Admitting: *Deleted

## 2023-07-21 DIAGNOSIS — N3091 Cystitis, unspecified with hematuria: Secondary | ICD-10-CM | POA: Insufficient documentation

## 2023-07-21 DIAGNOSIS — Z79899 Other long term (current) drug therapy: Secondary | ICD-10-CM | POA: Insufficient documentation

## 2023-07-21 DIAGNOSIS — R0781 Pleurodynia: Secondary | ICD-10-CM

## 2023-07-21 DIAGNOSIS — N3090 Cystitis, unspecified without hematuria: Secondary | ICD-10-CM

## 2023-07-21 DIAGNOSIS — S299XXA Unspecified injury of thorax, initial encounter: Secondary | ICD-10-CM | POA: Diagnosis present

## 2023-07-21 DIAGNOSIS — W19XXXA Unspecified fall, initial encounter: Secondary | ICD-10-CM | POA: Diagnosis not present

## 2023-07-21 DIAGNOSIS — S2242XA Multiple fractures of ribs, left side, initial encounter for closed fracture: Secondary | ICD-10-CM | POA: Insufficient documentation

## 2023-07-21 DIAGNOSIS — R319 Hematuria, unspecified: Secondary | ICD-10-CM | POA: Diagnosis not present

## 2023-07-21 LAB — POCT URINALYSIS DIP (MANUAL ENTRY)
Glucose, UA: NEGATIVE mg/dL
Leukocytes, UA: NEGATIVE
Nitrite, UA: POSITIVE — AB
Protein Ur, POC: >=300 mg/dL — AB
Spec Grav, UA: >=1.03 — AB (ref 1.010–1.025)
Urobilinogen, UA: 1 U/dL
pH, UA: 5.5 (ref 5.0–8.0)

## 2023-07-21 MED ORDER — KETOROLAC TROMETHAMINE 30 MG/ML IJ SOLN
INTRAMUSCULAR | Status: AC
  Filled 2023-07-21: qty 1

## 2023-07-21 MED ORDER — KETOROLAC TROMETHAMINE 30 MG/ML IJ SOLN
30.0000 mg | Freq: Once | INTRAMUSCULAR | Status: AC
  Administered 2023-07-21: 30 mg via INTRAMUSCULAR

## 2023-07-21 NOTE — ED Triage Notes (Signed)
 The pt fell 2 days ago  and fractured ribs 2 and 4  lt sided from the fall  today he has more pain and he has blood in his urine.  [facility] sent him  here to have his kidneys checked for a poss rib  injury to his kidneys

## 2023-07-21 NOTE — Discharge Instructions (Addendum)
 Sent to ED for CT scan

## 2023-07-21 NOTE — ED Provider Notes (Signed)
 [facility]    CSN: [id number] Arrival date & time: 07/21/23  1417      History   Chief Complaint Chief Complaint  Patient presents with   Rib Injury    HPI Christopher Pitts is a 48 y.o. male.  2 days ago walking outside, tripped over tree roots.  Reports he landed on his left chest and ribs. Rates the pain 7/10, from left upper chest to left side ribs and around to left back. Has not attempted any interventions for pain Reports he cannot move or breathe without pain Does not feel short of breath, more so restricted breath due to pain with expansion   Past Medical History:  Diagnosis Date   Asthma     There are no active problems to display for this patient.   History reviewed. No pertinent surgical history.     Home Medications    Prior to Admission medications   Medication Sig Start Date End Date Taking? Authorizing Provider  amoxicillin -clavulanate (AUGMENTIN ) 875-125 MG tablet Take 1 tablet by mouth every 12 (twelve) hours. 08/02/22   Raspet, Erin K, PA-C  doxycycline  (VIBRAMYCIN ) 100 MG capsule Take 1 capsule (100 mg total) by mouth 2 (two) times daily. 07/29/22   Roemhildt, Lorin T, PA-C  magic mouthwash (nystatin , lidocaine , diphenhydrAMINE, alum & mag hydroxide) suspension Swish and spit 5 mLs 4 (four) times daily as needed for mouth pain. 06/23/21   Afton Albright, MD    Family History Family History  Problem Relation Age of Onset   Diabetes Mother    Cancer Father     Social History Social History   Tobacco Use   Smoking status: Every Day    Current packs/day: 0.50    Types: Cigarettes   Smokeless tobacco: Never  Substance Use Topics   Alcohol use: Yes    Alcohol/week: 7.0 standard drinks of alcohol    Types: 7 Standard drinks or equivalent per week   Drug use: No     Allergies   Patient has no known allergies.   Review of Systems Review of Systems Per HPI  Physical Exam Triage Vital Signs ED Triage Vitals  Encounter  Vitals Group     BP 07/21/23 1504 (!) 146/76     Girls Systolic BP Percentile --      Girls Diastolic BP Percentile --      Boys Systolic BP Percentile --      Boys Diastolic BP Percentile --      Pulse Rate 07/21/23 1504 61     Resp 07/21/23 1504 14     Temp 07/21/23 1504 98 F (36.7 C)     Temp Source 07/21/23 1504 Oral     SpO2 07/21/23 1504 98 %     Weight --      Height --      Head Circumference --      Peak Flow --      Pain Score 07/21/23 1503 7     Pain Loc --      Pain Education --      Exclude from Growth Chart --    No data found.  Updated Vital Signs BP (!) 146/76 (BP Location: Right Arm)   Pulse 61   Temp 98 F (36.7 C) (Oral)   Resp 14   SpO2 98%    Physical Exam Vitals and nursing note reviewed.  Constitutional:      General: He is not in acute distress.    Appearance: Normal  appearance.     Comments: Uncomfortable appearing   HENT:     Mouth/Throat:     Pharynx: Oropharynx is clear.   Cardiovascular:     Rate and Rhythm: Normal rate and regular rhythm.     Pulses: Normal pulses.     Heart sounds: Normal heart sounds.  Pulmonary:     Breath sounds: Normal breath sounds.     Comments: Patient will not take full breath due to pain. Limits lung exam. There is air movement throughout  Abdominal:     Palpations: Abdomen is soft.   Musculoskeletal:       Arms:     Comments: Tender with light palpation over left chest, left ribs 6-12 anterior, lateral, and posterior. Patient grimace with touch. Full ROM of the shoulder without bony tenderness.    Skin:    General: Skin is warm and dry.     Comments: Small bruise (3-4 cm) left upper pectoral, no bruising noted over ribs including flank    Neurological:     Mental Status: He is alert and oriented to person, place, and time.     UC Treatments / Results  Labs (all labs ordered are listed, but only abnormal results are displayed) Labs Reviewed  POCT URINALYSIS DIP (MANUAL ENTRY) - Abnormal;  Notable for the following components:      Result Value   Color, UA orange (*)    Bilirubin, UA large (*)    Ketones, POC UA >= (160) (*)    Spec Grav, UA >=1.030 (*)    Blood, UA moderate (*)    Protein Ur, POC >=300 (*)    Nitrite, UA Positive (*)    All other components within normal limits    EKG  Radiology DG Ribs Unilateral W/Chest Left Result Date: 07/21/2023 CLINICAL DATA:  Fall 2 days back.  Rib pain. EXAM: LEFT RIBS AND CHEST - 3+ VIEW COMPARISON:  None Available. FINDINGS: Bilateral lung fields are clear. Bilateral costophrenic angles are clear. Normal cardio-mediastinal silhouette. There are mildly displaced fractures of the posteromedial left ninth and posterior left eleventh ribs. There is also probable fracture of the posteromedial left tenth rib. There also probable fractures of the anterior left eighth and ninth ribs. The soft tissues are within normal limits. IMPRESSION: *Multiple left-sided rib fractures, as described above. No pneumothorax or pleural effusion. Electronically Signed   By: Beula Brunswick M.D.   On: 07/21/2023 16:16    Procedures Procedures   Medications Ordered in UC Medications  ketorolac (TORADOL) 30 MG/ML injection 30 mg (30 mg Intramuscular Given 07/21/23 1555)    Initial Impression / Assessment and Plan / UC Course  I have reviewed the triage vital signs and the nursing notes.  Pertinent labs & imaging results that were available during my care of the patient were reviewed by me and considered in my medical decision making (see chart for details).  Afebrile, stable vitals Uncomfortable appearing and very tender on exam IM toradol given for pain with mild improvement.  Left chest and ribs xray with multiple fractures. Radiology reads as possible fractures of ribs 8-10. See radiology result note.  UA obtained to evaluate for blood Results with moderate RBC. He also has positive nitrates and >160 ketones. Elevated spec grav.  With several  rib fractures, severe pain, and blood in urine, I have advised being evaluated in the emergency department for CT scan to assess for kidney injury.   Patient would like to walk there. Stable vitals, have  directed him to the ED across the parking lot. Escorted by this provider  Final Clinical Impressions(s) / UC Diagnoses   Final diagnoses:  Closed fracture of multiple ribs of left side, initial encounter  Hematuria, unspecified type     Discharge Instructions      Sent to ED for CT scan     ED Prescriptions   None    PDMP not reviewed this encounter.   Creighton Doffing, [place] 07/21/23 1656

## 2023-07-21 NOTE — ED Notes (Addendum)
 Patient returned and placed WPT per charge RN.

## 2023-07-21 NOTE — ED Notes (Signed)
No answer from lobby or phone

## 2023-07-21 NOTE — ED Notes (Signed)
 No answer on phone or in lobby

## 2023-07-21 NOTE — ED Notes (Signed)
 Pt decided to leave from triage.

## 2023-07-21 NOTE — ED Notes (Signed)
 Patient is being discharged from the Urgent Care and sent to the Emergency Department via POV . Per Ivette Marks, [place], patient is in need of higher level of care due to fx ribs and blood in urine. Patient is aware and verbalizes understanding of plan of care.  Vitals:   07/21/23 1504  BP: (!) 146/76  Pulse: 61  Resp: 14  Temp: 98 F (36.7 C)  SpO2: 98%

## 2023-07-21 NOTE — ED Triage Notes (Signed)
 Pt was helping friend do work around house. Tripped over root in back yard. Patient c/o left rib and back pain. Hasn't tried anything for pain.

## 2023-07-22 ENCOUNTER — Telehealth: Payer: Self-pay

## 2023-07-22 ENCOUNTER — Emergency Department (HOSPITAL_COMMUNITY)

## 2023-07-22 LAB — CBC WITH DIFFERENTIAL/PLATELET
Abs Immature Granulocytes: 0.03 10*3/uL (ref 0.00–0.07)
Basophils Absolute: 0.1 10*3/uL (ref 0.0–0.1)
Basophils Relative: 1 %
Eosinophils Absolute: 0.2 10*3/uL (ref 0.0–0.5)
Eosinophils Relative: 2 %
HCT: 35.6 % — ABNORMAL LOW (ref 39.0–52.0)
Hemoglobin: 12.3 g/dL — ABNORMAL LOW (ref 13.0–17.0)
Immature Granulocytes: 0 %
Lymphocytes Relative: 24 %
Lymphs Abs: 2.3 10*3/uL (ref 0.7–4.0)
MCH: 34.1 pg — ABNORMAL HIGH (ref 26.0–34.0)
MCHC: 34.6 g/dL (ref 30.0–36.0)
MCV: 98.6 fL (ref 80.0–100.0)
Monocytes Absolute: 1.2 10*3/uL — ABNORMAL HIGH (ref 0.1–1.0)
Monocytes Relative: 12 %
Neutro Abs: 6.1 10*3/uL (ref 1.7–7.7)
Neutrophils Relative %: 61 %
Platelets: 257 10*3/uL (ref 150–400)
RBC: 3.61 MIL/uL — ABNORMAL LOW (ref 4.22–5.81)
RDW: 13.9 % (ref 11.5–15.5)
WBC: 9.9 10*3/uL (ref 4.0–10.5)
nRBC: 0 % (ref 0.0–0.2)

## 2023-07-22 LAB — I-STAT CHEM 8, ED
BUN: 17 mg/dL (ref 6–20)
Calcium, Ion: 0.94 mmol/L — ABNORMAL LOW (ref 1.15–1.40)
Chloride: 102 mmol/L (ref 98–111)
Creatinine, Ser: 0.9 mg/dL (ref 0.61–1.24)
Glucose, Bld: 87 mg/dL (ref 70–99)
HCT: 42 % (ref 39.0–52.0)
Hemoglobin: 14.3 g/dL (ref 13.0–17.0)
Potassium: 4 mmol/L (ref 3.5–5.1)
Sodium: 134 mmol/L — ABNORMAL LOW (ref 135–145)
TCO2: 20 mmol/L — ABNORMAL LOW (ref 22–32)

## 2023-07-22 MED ORDER — IOHEXOL 350 MG/ML SOLN
75.0000 mL | Freq: Once | INTRAVENOUS | Status: AC | PRN
  Administered 2023-07-22: 75 mL via INTRAVENOUS

## 2023-07-22 MED ORDER — KETOROLAC TROMETHAMINE 15 MG/ML IJ SOLN: 30.0000 mg | Freq: Four times a day (QID) | INTRAMUSCULAR | Status: DC

## 2023-07-22 MED ORDER — LIDOCAINE 5 % EX PTCH: 1.0000 | MEDICATED_PATCH | CUTANEOUS | 0 refills | Status: AC

## 2023-07-22 MED ORDER — LIDOCAINE 5 % EX PTCH
3.0000 | MEDICATED_PATCH | CUTANEOUS | Status: DC
  Filled 2023-07-22: qty 3

## 2023-07-22 MED ORDER — LIDOCAINE 5 % EX PTCH
2.0000 | MEDICATED_PATCH | Freq: Every day | CUTANEOUS | Status: DC
  Administered 2023-07-22: 2 via TRANSDERMAL
  Filled 2023-07-22: qty 2

## 2023-07-22 MED ORDER — DICLOFENAC SODIUM ER 100 MG PO TB24: 100.0000 mg | ORAL_TABLET | Freq: Every day | ORAL | 0 refills | Status: AC

## 2023-07-22 MED ORDER — KETOROLAC TROMETHAMINE 30 MG/ML IJ SOLN
30.0000 mg | Freq: Once | INTRAMUSCULAR | Status: AC
  Administered 2023-07-22: 30 mg via INTRAVENOUS
  Filled 2023-07-22: qty 1

## 2023-07-22 MED ORDER — ACETAMINOPHEN 500 MG PO TABS: 1000.0000 mg | ORAL_TABLET | Freq: Four times a day (QID) | ORAL | Status: DC

## 2023-07-22 MED ORDER — OXYCODONE HCL 5 MG PO TABS: 5.0000 mg | ORAL_TABLET | ORAL | Status: DC | PRN

## 2023-07-22 MED ORDER — CEPHALEXIN 500 MG PO CAPS: 500.0000 mg | ORAL_CAPSULE | Freq: Four times a day (QID) | ORAL | 0 refills | Status: AC

## 2023-07-22 MED ORDER — CEPHALEXIN 250 MG PO CAPS
500.0000 mg | ORAL_CAPSULE | Freq: Once | ORAL | Status: AC
  Administered 2023-07-22: 500 mg via ORAL
  Filled 2023-07-22: qty 2

## 2023-07-22 MED ORDER — METHOCARBAMOL 500 MG PO TABS: 1000.0000 mg | ORAL_TABLET | Freq: Three times a day (TID) | ORAL | Status: DC

## 2023-07-22 NOTE — ED Provider Notes (Signed)
 [place] EM[facility]GENCY DEPARTMENT AT [facility] Provider Note   CSN: [id number] Arrival date & time: 07/21/23  1657     Patient presents with: Hematuria and rib fractures   Christopher Pitts is a 48 y.o. male.   The history is provided by the patient.  Hematuria This is a new problem. The problem occurs constantly. The problem has not changed since onset.Pertinent negatives include no chest pain, no abdominal pain and no headaches. Nothing aggravates the symptoms. Nothing relieves the symptoms. He has tried nothing for the symptoms. The treatment provided no relief.  Marvell Slider over a tree stump and has broken ribs but sent in by urgent care for blood in the urine in the setting of rib fractures. Urine is consistent with UTI     Prior to Admission medications   Medication Sig Start Date End Date Taking? Authorizing Provider  amoxicillin -clavulanate (AUGMENTIN ) 875-125 MG tablet Take 1 tablet by mouth every 12 (twelve) hours. 08/02/22   Raspet, Erin K, PA-C  doxycycline  (VIBRAMYCIN ) 100 MG capsule Take 1 capsule (100 mg total) by mouth 2 (two) times daily. 07/29/22   Roemhildt, Lorin T, PA-C  magic mouthwash (nystatin , lidocaine , diphenhydrAMINE, alum & mag hydroxide) suspension Swish and spit 5 mLs 4 (four) times daily as needed for mouth pain. 06/23/21   Afton Albright, MD    Allergies: Patient has no known allergies.    Review of Systems  Cardiovascular:  Negative for chest pain.  Gastrointestinal:  Negative for abdominal pain.  Genitourinary:  Positive for hematuria.  Neurological:  Negative for headaches.  All other systems reviewed and are negative.   Updated Vital Signs BP 131/78   Pulse 67   Temp 98.3 F (36.8 C) (Oral)   Resp 18   Ht 6' 1 (1.854 m)   Wt 70.3 kg   SpO2 100%   BMI 20.45 kg/m   Physical Exam Vitals and nursing note reviewed.  Constitutional:      General: He is not in acute distress.    Appearance: Normal appearance. He is well-developed. He  is not diaphoretic.  HENT:     Head: Normocephalic and atraumatic.     Nose: Nose normal.   Eyes:     Conjunctiva/sclera: Conjunctivae normal.     Pupils: Pupils are equal, round, and reactive to light.    Cardiovascular:     Rate and Rhythm: Normal rate and regular rhythm.     Pulses: Normal pulses.     Heart sounds: Normal heart sounds.  Pulmonary:     Effort: Pulmonary effort is normal.     Breath sounds: Normal breath sounds. No wheezing or rales.  Abdominal:     General: Bowel sounds are normal.     Palpations: Abdomen is soft.     Tenderness: There is no abdominal tenderness. There is no guarding or rebound.   Musculoskeletal:        General: Normal range of motion.     Cervical back: Normal range of motion and neck supple.   Skin:    General: Skin is warm and dry.     Capillary Refill: Capillary refill takes less than 2 seconds.   Neurological:     General: No focal deficit present.     Mental Status: He is alert and oriented to person, place, and time.     Deep Tendon Reflexes: Reflexes normal.   Psychiatric:        Mood and Affect: Mood normal.  Behavior: Behavior normal.     (all labs ordered are listed, but only abnormal results are displayed) Results for orders placed or performed during the hospital encounter of 07/21/23  I-stat chem 8, ED (not at [facility], [facility] or [facility])   Collection Time: 07/22/23 12:03 AM  Result Value Ref Range   Sodium 134 (L) 135 - 145 mmol/L   Potassium 4.0 3.5 - 5.1 mmol/L   Chloride 102 98 - 111 mmol/L   BUN 17 6 - 20 mg/dL   Creatinine, Ser 5.62 0.61 - 1.24 mg/dL   Glucose, Bld 87 70 - 99 mg/dL   Calcium, Ion 1.30 (L) 1.15 - 1.40 mmol/L   TCO2 20 (L) 22 - 32 mmol/L   Hemoglobin 14.3 13.0 - 17.0 g/dL   HCT 86.5 78.4 - 69.6 %  CBC with Differential/Platelet   Collection Time: 07/22/23 12:56 AM  Result Value Ref Range   WBC 9.9 4.0 - 10.5 K/uL   RBC 3.61 (L) 4.22 - 5.81 MIL/uL   Hemoglobin 12.3 (L) 13.0 - 17.0 g/dL    HCT 29.5 (L) 28.4 - 52.0 %   MCV 98.6 80.0 - 100.0 fL   MCH 34.1 (H) 26.0 - 34.0 pg   MCHC 34.6 30.0 - 36.0 g/dL   RDW 13.2 44.0 - 10.2 %   Platelets 257 150 - 400 K/uL   nRBC 0.0 0.0 - 0.2 %   Neutrophils Relative % 61 %   Neutro Abs 6.1 1.7 - 7.7 K/uL   Lymphocytes Relative 24 %   Lymphs Abs 2.3 0.7 - 4.0 K/uL   Monocytes Relative 12 %   Monocytes Absolute 1.2 (H) 0.1 - 1.0 K/uL   Eosinophils Relative 2 %   Eosinophils Absolute 0.2 0.0 - 0.5 K/uL   Basophils Relative 1 %   Basophils Absolute 0.1 0.0 - 0.1 K/uL   Immature Granulocytes 0 %   Abs Immature Granulocytes 0.03 0.00 - 0.07 K/uL   CT CHEST ABDOMEN PELVIS W CONTRAST Result Date: 07/22/2023 CLINICAL DATA:  Polytrauma, blunt EXAM: CT CHEST, ABDOMEN, AND PELVIS WITH CONTRAST TECHNIQUE: Multidetector CT imaging of the chest, abdomen and pelvis was performed following the standard protocol during bolus administration of intravenous contrast. RADIATION DOSE REDUCTION: This exam was performed according to the departmental dose-optimization program which includes automated exposure control, adjustment of the mA and/or kV according to patient size and/or use of iterative reconstruction technique. CONTRAST:  75mL OMNIPAQUE  IOHEXOL  350 MG/ML SOLN COMPARISON:  None Available. FINDINGS: CHEST: Cardiovascular: No aortic injury. The thoracic aorta is normal in caliber. The heart is normal in size. No significant pericardial effusion. The main pulmonary artery is normal in caliber. No central pulmonary embolus. Mediastinum/Nodes: No pneumomediastinum. No mediastinal hematoma. The esophagus is unremarkable. The thyroid is unremarkable. The central airways are patent. No mediastinal, hilar, or axillary lymphadenopathy. Lungs/Pleura: Biapical paraseptal emphysematous changes. Irregular greater than 1 cm thin walled cysts throughout the lungs. No focal consolidation. No pulmonary nodule. No pulmonary mass. No pulmonary contusion or laceration. No  pneumatocele formation. No pleural effusion. No pneumothorax. No hemothorax. Musculoskeletal/Chest wall: No chest wall mass. Posterior left 9-11 displaced rib fractures.  No spinal fracture. ABDOMEN / PELVIS: Hepatobiliary: Not enlarged. No focal lesion. No laceration or subcapsular hematoma. The gallbladder is otherwise unremarkable with no radio-opaque gallstones. No biliary ductal dilatation. Pancreas: Normal pancreatic contour. No main pancreatic duct dilatation. Spleen: Not enlarged. Linear hypodensity along the spleen consistent with splenic trabecula (6:30 4-38). No definite laceration. No focal lesion. No laceration,  subcapsular hematoma, or vascular injury. Adrenals/Urinary Tract: No nodularity bilaterally. Bilateral kidneys enhance symmetrically. No hydronephrosis. No contusion, laceration, or subcapsular hematoma. No injury to the vascular structures or collecting systems. No hydroureter. The urinary bladder is unremarkable. Stomach/Bowel: No small or large bowel wall thickening or dilatation. The appendix is unremarkable. Vasculature/Lymphatics: Mild atherosclerotic plaque. No abdominal aorta or iliac aneurysm. No active contrast extravasation or pseudoaneurysm. No abdominal, pelvic, inguinal lymphadenopathy. Reproductive: Normal. Other: No simple free fluid ascites. No pneumoperitoneum. No hemoperitoneum. No mesenteric hematoma identified. No organized fluid collection. Musculoskeletal: No significant soft tissue hematoma. No acute pelvic fracture. No spinal fracture. Other ports and devices: None. IMPRESSION: 1. Posterior left 9-11 displaced rib fractures. No associated pneumothorax. 2. No acute intrathoracic, intra-abdominal, intrapelvic traumatic injury. 3. No acute fracture or traumatic malalignment of the thoracic or lumbar spine. 4.  Emphysema (ICD10-J43.9). Electronically Signed   By: Morgane  Naveau M.D.   On: 07/22/2023 00:40   DG Ribs Unilateral W/Chest Left Result Date: 07/21/2023 CLINICAL  DATA:  Fall 2 days back.  Rib pain. EXAM: LEFT RIBS AND CHEST - 3+ VIEW COMPARISON:  None Available. FINDINGS: Bilateral lung fields are clear. Bilateral costophrenic angles are clear. Normal cardio-mediastinal silhouette. There are mildly displaced fractures of the posteromedial left ninth and posterior left eleventh ribs. There is also probable fracture of the posteromedial left tenth rib. There also probable fractures of the anterior left eighth and ninth ribs. The soft tissues are within normal limits. IMPRESSION: *Multiple left-sided rib fractures, as described above. No pneumothorax or pleural effusion. Electronically Signed   By: Beula Brunswick M.D.   On: 07/21/2023 16:16    Radiology: CT CHEST ABDOMEN PELVIS W CONTRAST Result Date: 07/22/2023 CLINICAL DATA:  Polytrauma, blunt EXAM: CT CHEST, ABDOMEN, AND PELVIS WITH CONTRAST TECHNIQUE: Multidetector CT imaging of the chest, abdomen and pelvis was performed following the standard protocol during bolus administration of intravenous contrast. RADIATION DOSE REDUCTION: This exam was performed according to the departmental dose-optimization program which includes automated exposure control, adjustment of the mA and/or kV according to patient size and/or use of iterative reconstruction technique. CONTRAST:  75mL OMNIPAQUE  IOHEXOL  350 MG/ML SOLN COMPARISON:  None Available. FINDINGS: CHEST: Cardiovascular: No aortic injury. The thoracic aorta is normal in caliber. The heart is normal in size. No significant pericardial effusion. The main pulmonary artery is normal in caliber. No central pulmonary embolus. Mediastinum/Nodes: No pneumomediastinum. No mediastinal hematoma. The esophagus is unremarkable. The thyroid is unremarkable. The central airways are patent. No mediastinal, hilar, or axillary lymphadenopathy. Lungs/Pleura: Biapical paraseptal emphysematous changes. Irregular greater than 1 cm thin walled cysts throughout the lungs. No focal consolidation. No  pulmonary nodule. No pulmonary mass. No pulmonary contusion or laceration. No pneumatocele formation. No pleural effusion. No pneumothorax. No hemothorax. Musculoskeletal/Chest wall: No chest wall mass. Posterior left 9-11 displaced rib fractures.  No spinal fracture. ABDOMEN / PELVIS: Hepatobiliary: Not enlarged. No focal lesion. No laceration or subcapsular hematoma. The gallbladder is otherwise unremarkable with no radio-opaque gallstones. No biliary ductal dilatation. Pancreas: Normal pancreatic contour. No main pancreatic duct dilatation. Spleen: Not enlarged. Linear hypodensity along the spleen consistent with splenic trabecula (6:30 4-38). No definite laceration. No focal lesion. No laceration, subcapsular hematoma, or vascular injury. Adrenals/Urinary Tract: No nodularity bilaterally. Bilateral kidneys enhance symmetrically. No hydronephrosis. No contusion, laceration, or subcapsular hematoma. No injury to the vascular structures or collecting systems. No hydroureter. The urinary bladder is unremarkable. Stomach/Bowel: No small or large bowel wall thickening or dilatation. The appendix is  unremarkable. Vasculature/Lymphatics: Mild atherosclerotic plaque. No abdominal aorta or iliac aneurysm. No active contrast extravasation or pseudoaneurysm. No abdominal, pelvic, inguinal lymphadenopathy. Reproductive: Normal. Other: No simple free fluid ascites. No pneumoperitoneum. No hemoperitoneum. No mesenteric hematoma identified. No organized fluid collection. Musculoskeletal: No significant soft tissue hematoma. No acute pelvic fracture. No spinal fracture. Other ports and devices: None. IMPRESSION: 1. Posterior left 9-11 displaced rib fractures. No associated pneumothorax. 2. No acute intrathoracic, intra-abdominal, intrapelvic traumatic injury. 3. No acute fracture or traumatic malalignment of the thoracic or lumbar spine. 4.  Emphysema (ICD10-J43.9). Electronically Signed   By: Morgane  Naveau M.D.   On:  07/22/2023 00:40   DG Ribs Unilateral W/Chest Left Result Date: 07/21/2023 CLINICAL DATA:  Fall 2 days back.  Rib pain. EXAM: LEFT RIBS AND CHEST - 3+ VIEW COMPARISON:  None Available. FINDINGS: Bilateral lung fields are clear. Bilateral costophrenic angles are clear. Normal cardio-mediastinal silhouette. There are mildly displaced fractures of the posteromedial left ninth and posterior left eleventh ribs. There is also probable fracture of the posteromedial left tenth rib. There also probable fractures of the anterior left eighth and ninth ribs. The soft tissues are within normal limits. IMPRESSION: *Multiple left-sided rib fractures, as described above. No pneumothorax or pleural effusion. Electronically Signed   By: Beula Brunswick M.D.   On: 07/21/2023 16:16     Procedures   Medications Ordered in the ED  ketorolac (TORADOL) 30 MG/ML injection 30 mg (has no administration in time range)  lidocaine  (LIDODERM ) 5 % 3 patch (has no administration in time range)  cephALEXin  (KEFLEX ) capsule 500 mg (has no administration in time range)  iohexol  (OMNIPAQUE ) 350 MG/ML injection 75 mL (75 mLs Intravenous Contrast Given 07/22/23 0024)                                    Medical Decision Making Patient with rib fractures seen in for hematuria   Amount and/or Complexity of Data Reviewed Independent Historian:     Details: Urgent care note External Data Reviewed: labs and notes.    Details: Previous notes reviewed urine is consistent with UTI Labs: ordered.    Details: Normal white count 9.9, slight low hemoglobin 12.3, normal electrolytes creatinine 0.94, normal potassium 4 Radiology: ordered and independent interpretation performed.    Details: Rib fractures left.  No liver or splenic injury   Risk Prescription drug management. Risk Details: Well appearing.  Will start incentive spirometer and pain medication and PRN follow up in trauma.  Clinic.  UTI will be treated with keflex  and patient  to follow up with urology for ongoing care.  Stable for discharge.       Final diagnoses:  Cystitis  Closed fracture of multiple ribs of left side, initial encounter   No signs of systemic illness or infection. The patient is nontoxic-appearing on exam and vital signs are within normal limits.  I have reviewed the triage vital signs and the nursing notes. Pertinent labs & imaging results that were available during my care of the patient were reviewed by me and considered in my medical decision making (see chart for details). After history, exam, and medical workup I feel the patient has been appropriately medically screened and is safe for discharge home. Pertinent diagnoses were discussed with the patient. Patient was given return precautions.    ED Discharge Orders     ED Discharge Orders     None  Kristoph Sattler, MD 07/22/23 [phone]

## 2023-07-22 NOTE — Consult Note (Signed)
   Patient ID: Christopher Pitts 25-Feb-1975  Admit date: 07/21/2023 Admitting Diagnoses: Mechanical GLF, rib fx  HPI: 31M reports tripping over a tree root in the yard on 07/19/23. Pain persisted at home causing him to present to UC where he was noted to have several rib fractures and blood in my urine. CT CAP with posterior left 9-11 displaced rib fractures.  Tertiary Survey: Trauma scans reviewed: Yes Additional Imaging recommendations: No Labs reviewed: Yes Additional lab work recommendations: Yes: urine culture  Clinical recommendations: pain regimen-tylenol  1g q6h, robaxin 1g q6h, toradol 30mg  q6h, oxycodone  5-10mg  q4h prn, lidocaine  patches x2 (anterior and posterior). All of this has been ordered If this regimen ineffective, consider adding gabapentin 300mg  TID. Incentive spirometer, pulmonary toilet.  Does the patient have any new complaints? No Cervical Collar Cleared: Yes DVT ppx ordered? No Note: dosing for DVT prophylaxis in the setting of trauma and normal renal function is 30mg  BID or 40mg  BID if >100kg or BMI>35 Is patient age > 17 (48 y.o.) : No  Exam:  GCS: E(4)//V(5)//M(6) Constitutional: well-developed, well-nourished Skull: normocephalic, atraumatic Eyes: pupils equal, round, reactive to light, 2mm b/l, moist conjunctiva Face/ENT: midface stable without deformity, normal  dentition, external inspection of ears and nose normal, hearing intact  Oropharynx: normal oropharyngeal mucosa, no blood,   Neck: no thyromegaly, trachea midline, c-collar not applied due to mechanism, no midline cervical tenderness to palpation, no C-spine stepoffs Chest: breath sounds equal bilaterally, normal  respiratory effort, +L lower anterior and posterior chest wall tenderness to palpation without deformity Abdomen: soft, NT, no bruising, no hepatosplenomegaly FAST: not performed Pelvis: stable Back: no wounds, no T/L spine TTP, no T/L spine stepoffs Rectal: deferred Extremities: 2+   radial and pedal pulses bilaterally, intact motor and sensation of bilateral UE and LE, no peripheral edema MSK: unable to assess gait/station, no clubbing/cyanosis of fingers/toes, normal ROM of all four extremities Skin: warm, dry, no rashes  Incidental Findings: none  Wound Care: none  Follow-up Appointments: Trauma clinic PRN  Frailty Score: N/A   Anda Bamberg, MD General and Trauma Surgery [facility]

## 2023-07-22 NOTE — Telephone Encounter (Signed)
 Patient called in to state that his isurnace would not cover difocnac or lidocaine  patches per the pharmacy. Discussed Llidocaine was OTC, spoke about NSAIDS  Encouraged use orf good RX, message Dr Aniceto Barley as well

## 2023-07-22 NOTE — ED Notes (Signed)
 Patient transported to CT
